# Patient Record
Sex: Male | Born: 1935
Health system: Southern US, Community
[De-identification: ages and names within clinical notes are randomized; demographics above are authoritative.]

## PROBLEM LIST (undated history)

## (undated) DIAGNOSIS — R0600 Dyspnea, unspecified: Secondary | ICD-10-CM

## (undated) DIAGNOSIS — M51369 Other intervertebral disc degeneration, lumbar region without mention of lumbar back pain or lower extremity pain: Secondary | ICD-10-CM

## (undated) DIAGNOSIS — T148XXA Other injury of unspecified body region, initial encounter: Secondary | ICD-10-CM

## (undated) DIAGNOSIS — S2249XA Multiple fractures of ribs, unspecified side, initial encounter for closed fracture: Secondary | ICD-10-CM

## (undated) DIAGNOSIS — I10 Essential (primary) hypertension: Secondary | ICD-10-CM

## (undated) DIAGNOSIS — R079 Chest pain, unspecified: Secondary | ICD-10-CM

## (undated) DIAGNOSIS — E785 Hyperlipidemia, unspecified: Secondary | ICD-10-CM

## (undated) DIAGNOSIS — M1990 Unspecified osteoarthritis, unspecified site: Secondary | ICD-10-CM

## (undated) DIAGNOSIS — C61 Malignant neoplasm of prostate: Secondary | ICD-10-CM

## (undated) DIAGNOSIS — G629 Polyneuropathy, unspecified: Secondary | ICD-10-CM

## (undated) DIAGNOSIS — S36112A Contusion of liver, initial encounter: Secondary | ICD-10-CM

## (undated) DIAGNOSIS — S2239XA Fracture of one rib, unspecified side, initial encounter for closed fracture: Secondary | ICD-10-CM

## (undated) DIAGNOSIS — M5136 Other intervertebral disc degeneration, lumbar region: Secondary | ICD-10-CM

## (undated) DIAGNOSIS — K219 Gastro-esophageal reflux disease without esophagitis: Secondary | ICD-10-CM

## (undated) HISTORY — DX: Other injury of unspecified body region, initial encounter: T14.8XXA

## (undated) HISTORY — DX: Contusion of liver, initial encounter: S36.112A

## (undated) HISTORY — DX: Chest pain, unspecified: R07.9

## (undated) HISTORY — PX: TONSILLECTOMY AND ADENOIDECTOMY: SHX28

## (undated) HISTORY — DX: Essential (primary) hypertension: I10

## (undated) HISTORY — DX: Gastro-esophageal reflux disease without esophagitis: K21.9

## (undated) HISTORY — PX: OTHER SURGICAL HISTORY: SHX169

## (undated) HISTORY — DX: Malignant neoplasm of prostate: C61

## (undated) HISTORY — DX: Dyspnea, unspecified: R06.00

## (undated) HISTORY — DX: Hyperlipidemia, unspecified: E78.5

## (undated) HISTORY — DX: Unspecified osteoarthritis, unspecified site: M19.90

## (undated) HISTORY — DX: Multiple fractures of ribs, unspecified side, initial encounter for closed fracture: S22.49XA

## (undated) HISTORY — DX: Fracture of one rib, unspecified side, initial encounter for closed fracture: S22.39XA

## (undated) HISTORY — PX: TONSILLECTOMY: SUR1361

## (undated) HISTORY — DX: Other intervertebral disc degeneration, lumbar region: M51.36

## (undated) HISTORY — DX: Other intervertebral disc degeneration, lumbar region without mention of lumbar back pain or lower extremity pain: M51.369

---

## 1978-07-31 DIAGNOSIS — T148XXA Other injury of unspecified body region, initial encounter: Secondary | ICD-10-CM

## 1978-07-31 HISTORY — DX: Other injury of unspecified body region, initial encounter: T14.8XXA

## 1994-06-30 DIAGNOSIS — C61 Malignant neoplasm of prostate: Secondary | ICD-10-CM

## 1994-06-30 HISTORY — PX: RADIOACTIVE SEED IMPLANT: SHX5150

## 1994-06-30 HISTORY — DX: Malignant neoplasm of prostate: C61

## 2000-05-01 ENCOUNTER — Encounter (INDEPENDENT_AMBULATORY_CARE_PROVIDER_SITE_OTHER): Payer: Self-pay | Admitting: Specialist

## 2000-05-01 ENCOUNTER — Other Ambulatory Visit: Admission: RE | Admit: 2000-05-01 | Discharge: 2000-05-01 | Payer: Self-pay | Admitting: Urology

## 2000-07-31 HISTORY — PX: CARDIAC CATHETERIZATION: SHX172

## 2008-05-08 ENCOUNTER — Ambulatory Visit (HOSPITAL_COMMUNITY): Admission: RE | Admit: 2008-05-08 | Discharge: 2008-05-08 | Payer: Self-pay | Admitting: Urology

## 2009-06-09 ENCOUNTER — Encounter: Payer: Self-pay | Admitting: Cardiology

## 2009-08-09 ENCOUNTER — Ambulatory Visit: Payer: Self-pay | Admitting: Cardiology

## 2009-08-09 DIAGNOSIS — I10 Essential (primary) hypertension: Secondary | ICD-10-CM | POA: Insufficient documentation

## 2009-08-09 DIAGNOSIS — E669 Obesity, unspecified: Secondary | ICD-10-CM

## 2009-08-09 DIAGNOSIS — E785 Hyperlipidemia, unspecified: Secondary | ICD-10-CM | POA: Insufficient documentation

## 2009-08-09 DIAGNOSIS — R0602 Shortness of breath: Secondary | ICD-10-CM | POA: Insufficient documentation

## 2009-08-18 ENCOUNTER — Ambulatory Visit: Payer: Self-pay | Admitting: Cardiology

## 2009-08-18 ENCOUNTER — Encounter: Payer: Self-pay | Admitting: Cardiology

## 2009-09-14 ENCOUNTER — Ambulatory Visit: Payer: Self-pay | Admitting: Cardiology

## 2009-10-25 ENCOUNTER — Telehealth (INDEPENDENT_AMBULATORY_CARE_PROVIDER_SITE_OTHER): Payer: Self-pay | Admitting: *Deleted

## 2009-11-10 ENCOUNTER — Telehealth (INDEPENDENT_AMBULATORY_CARE_PROVIDER_SITE_OTHER): Payer: Self-pay | Admitting: *Deleted

## 2010-08-30 NOTE — Assessment & Plan Note (Signed)
Summary: np- decreasing exercise tolerance despite   Visit Type:  Initial Consult Primary Provider:  Dr. Margo Common  CC:  Dyspnea.  History of Present Illness: The patient presents for evaluation of dyspnea. This has been ongoing for about 8-9 months. He reports that he has had increasing shortness of breath with activity such as walking 150 feet up a slight incline in his yard. He can contact like he used to. He can't walk on the treadmill for more than 5 minutes without getting some dyspnea. Previously he could walk 30 minutes. He will stop what he is doing and his symptoms will resolve after 2-3 minutes. He does not describe resting shortness of breath, PND or orthopnea. He does not report chest pressure, neck or arm discomfort. He has not had palpitations, presyncope or syncope. He says he's tried to maintain the same level of activity. He has gained 15 pounds in the last 2-3 years. He has no acute weight gain however and no edema.  Of note the patient did have a cardiac catheterization about 9 years ago. He reports that this was normal.  Preventive Screening-Counseling & Management  Alcohol-Tobacco     Smoking Status: never     Year Quit: 1969  Caffeine-Diet-Exercise     Does Patient Exercise: yes      Drug Use:  no.    Current Medications (verified): 1)  Losartan Potassium 50 Mg Tabs (Losartan Potassium) .... Take 1 Tablet By Mouth Once A Day 2)  Simvastatin 10 Mg Tabs (Simvastatin) .... Take 1 Tab By Mouth At Bedtime 3)  Tamsulosin Hcl 0.4 Mg Caps (Tamsulosin Hcl) .... Take 1 Tablet By Mouth Once A Day 4)  Diclofenac Sodium 75 Mg Tbec (Diclofenac Sodium) .... As Needed 5)  Aspir-Low 81 Mg Tbec (Aspirin) .... Take 1 Tablet By Mouth Once A Day 6)  Lupron Depot 3.75 Mg Kit (Leuprolide Acetate) .... Take 3 X Year  Allergies (verified): No Known Drug Allergies  Past History:  Past Medical History: Prostate cancer (18 years ago, seed implants) Arthritis Cath 9 years  ago HTN  Past Surgical History: Cervical spine fusion T/A  Family History: Mother: Family History of Cancer: kidney Father: Family History of Cancer: lung Siblings: Family History of Diabetes:  Brother  CABG (age 38) Child deceased at age 60 with cystic fibrosis  Social History: Retired  Married  Tobacco Use - Quit 1960s after smoking 15 years 1/5 ppd  Alcohol Use - no Regular Exercise - yes Drug Use - no Smoking Status:  never Does Patient Exercise:  yes Drug Use:  no  Review of Systems       As stated in the HPI and negative for all other systems.   Vital Signs:  Patient profile:   77 year old male Height:      69 inches Weight:      235 pounds BMI:     34.83 Pulse rate:   77 / minute BP sitting:   153 / 90  (left arm) Cuff size:   regular  Vitals Entered By: Hoover Brunette, LPN (August 09, 2009 1:30 PM)  Nutrition Counseling: Patient's BMI is greater than 25 and therefore counseled on weight management options. CC: Dyspnea Is Patient Diabetic? No Comments dypnea on exertion.  usually works out 3 x week for 30 - 45 minutes, now only able to stay on 10 -15 minutes.     Physical Exam  General:  Well developed, well nourished, in no acute distress. Head:  normocephalic and  atraumatic Eyes:  PERRLA/EOM intact; conjunctiva and lids normal. Mouth:  Teeth, gums and palate normal. Oral mucosa normal. Neck:  Neck supple, no JVD. No masses, thyromegaly or abnormal cervical nodes. Chest Wall:  no deformities or breast masses noted Lungs:  Clear bilaterally to auscultation and percussion. Abdomen:  Bowel sounds positive; abdomen soft and non-tender without masses, organomegaly, or hernias noted. No hepatosplenomegaly, obese Msk:  Back normal, normal gait. Muscle strength and tone normal. Extremities:  No clubbing or cyanosis. Neurologic:  Alert and oriented x 3. Skin:  Intact without lesions or rashes. Cervical Nodes:  no significant adenopathy Axillary Nodes:  no  significant adenopathy Inguinal Nodes:  no significant adenopathy Psych:  Normal affect.   Detailed Cardiovascular Exam  Neck    Carotids: Carotids full and equal bilaterally without bruits.      Neck Veins: Normal, no JVD.    Heart    Inspection: no deformities or lifts noted.      Palpation: normal PMI with no thrills palpable.      Auscultation: regular rate and rhythm, S1, S2 without murmurs, rubs, gallops, or clicks.    Vascular    Abdominal Aorta: no palpable masses, pulsations, or audible bruits.      Femoral Pulses: normal femoral pulses bilaterally.      Pedal Pulses: normal pedal pulses bilaterally.      Radial Pulses: normal radial pulses bilaterally.      Peripheral Circulation: no clubbing, cyanosis, or edema noted with normal capillary refill.     EKG  Procedure date:  08/09/2009  Findings:      sinus rhythm, rate 61, axis within normal limits, right bundle branch block, no acute ST-T wave changes.  Impression & Recommendations:  Problem # 1:  DYSPNEA (ICD-786.05) I do not strongly suspect a cardiac etiology to this. The only objective finding is his weight and his slightly abnormal EKG. I think the pretest probability of obstructive coronary disease is low. An exercise treadmill test is indicated. I will also check a BNP level. If these are both within normal limits then I will plan pulmonary function testing. Orders: EKG w/ Interpretation (93000) GXT (GXT) T-BNP  (B Natriuretic Peptide) (25366-44034)  Problem # 2:  ESSENTIAL HYPERTENSION, BENIGN (ICD-401.1)  His blood pressure is slightly elevated. I have asked him to drop some weight. We will see what his blood pressure does on the treadmill. He may need medication adjustments. I do not have any prior readings to work with.  His updated medication list for this problem includes:    Losartan Potassium 50 Mg Tabs (Losartan potassium) .Marland Kitchen... Take 1 tablet by mouth once a day    Aspir-low 81 Mg Tbec (Aspirin)  .Marland Kitchen... Take 1 tablet by mouth once a day  Problem # 3:  OBESITY, UNSPECIFIED (ICD-278.00) He and I discussed the need to lose weight with diet and exercise.  Problem # 4:  DYSLIPIDEMIA (ICD-272.4) I reviewed his lipid profile done in November. LDL was 67, HDL 47 and triglycerides 164. He should reduce the fats in his diet but will otherwise remain on the meds as listed.  Patient Instructions: 1)  Your physician has requested that you have an exercise tolerance test.  For further information please visit [email or web address]  Please also follow instruction sheet, as given. We will contact you with an appointment  2)  Your physician recommends that you return for lab work [id number] at the [facility]. 3)  Your physician recommends that you schedule a follow-up appointment in:  FEBRUARY 15TH 2011 @8 :30AM WITH DR. Azaylah Stailey. 4)  Your physician recommends that you continue on your current medications as directed. Please refer to the Current Medication list given to you today.  Appended Document: np- decreasing exercise tolerance despite OK  Appended Document: np- decreasing exercise tolerance despite Patient informed of the above.

## 2010-08-30 NOTE — Assessment & Plan Note (Signed)
Summary: 1 mo fu -srs   Visit Type:  Follow-up Primary Provider:  Dr. Wyvonnia Lora  CC:  Dyspnea.  History of Present Illness: The patient presents for evaluation of dyspnea.  At the first visit I referred him for an exercise treadmill test. He was able to reach 85% of his target heart rate and had no evidence of ischemia. Since that time he has continued to have dyspnea. However, he thinks this is slowly improving. He has been able to increase his exercise treadmill time. He denies any chest discomfort, neck or arm discomfort. He has had no palpitations, presyncope or syncope. He has had no PND or orthopnea. Of note he has had increased blood pressures with systolics typically in the 150s at home.  This is consistent with readings here in the office.  Preventive Screening-Counseling & Management  Alcohol-Tobacco     Smoking Status: quit > 6 months  Comments: Pt smoked from about 77 yo to 77 yo.  Current Medications (verified): 1)  Losartan Potassium 50 Mg Tabs (Losartan Potassium) .... Take 1 Tablet By Mouth Once A Day 2)  Simvastatin 40 Mg Tabs (Simvastatin) .... Take One Tablet By Mouth Daily At Bedtime 3)  Tamsulosin Hcl 0.4 Mg Caps (Tamsulosin Hcl) .... Take 1 Tablet By Mouth Once A Day 4)  Diclofenac Sodium 75 Mg Tbec (Diclofenac Sodium) .... As Needed 5)  Aspir-Low 81 Mg Tbec (Aspirin) .... Take 1 Tablet By Mouth Once A Day 6)  Lupron Depot 3.75 Mg Kit (Leuprolide Acetate) .... Injection Once Every 4 Months 7)  Hydrochlorothiazide 12.5 Mg Tabs (Hydrochlorothiazide) .... Take One Tablet By Mouth Daily. 8)  Losartan Potassium-Hctz 50-12.5 Mg Tabs (Losartan Potassium-Hctz) .... Take 1 Tablet By Mouth Once A Day (Place On File)  Allergies (verified): No Known Drug Allergies  Comments:  Nurse/Medical Assistant: The patient's medications were reviewed with the patient and were updated in the Medication List. Pt brought a list of medications to office visit.  Cyril Loosen, RN,  BSN (September 14, 2009 8:32 AM)  Past History:  Past Medical History: Reviewed history from 08/09/2009 and no changes required. Prostate cancer (18 years ago, seed implants) Arthritis Cath 9 years ago HTN  Past Surgical History: Reviewed history from 08/09/2009 and no changes required. Cervical spine fusion T/A  Social History: Smoking Status:  quit > 6 months  Review of Systems       As stated in the HPI and negative for all other systems.   Vital Signs:  Patient profile:   77 year old male Height:      69 inches Weight:      233 pounds BMI:     34.53 Pulse rate:   56 / minute BP sitting:   151 / 84  (left arm) Cuff size:   regular  Vitals Entered By: Cyril Loosen, RN, BSN (September 14, 2009 8:27 AM)  Nutrition Counseling: Patient's BMI is greater than 25 and therefore counseled on weight management options. CC: Dyspnea Comments Follow up visit. No cardiac complaints.   Physical Exam  General:  Well developed, well nourished, in no acute distress. Head:  normocephalic and atraumatic Eyes:  PERRLA/EOM intact; conjunctiva and lids normal. Mouth:  Teeth, gums and palate normal. Oral mucosa normal. Neck:  Neck supple, no JVD. No masses, thyromegaly or abnormal cervical nodes. Chest Wall:  no deformities or breast masses noted Lungs:  Clear bilaterally to auscultation and percussion. Abdomen:  Bowel sounds positive; abdomen soft and non-tender without masses, organomegaly, or hernias  noted. No hepatosplenomegaly, obese Msk:  Back normal, normal gait. Muscle strength and tone normal. Pulses:  normal pedal pulses bilaterally.   Extremities:  No clubbing or cyanosis. Neurologic:  Alert and oriented x 3. Skin:  Intact without lesions or rashes. Cervical Nodes:  no significant adenopathy Axillary Nodes:  no significant adenopathy Inguinal Nodes:  no significant adenopathy Psych:  Normal affect.   Detailed Cardiovascular Exam  Neck    Carotids: Carotids full  and equal bilaterally without bruits.      Neck Veins: Normal, no JVD.    Heart    Inspection: no deformities or lifts noted.      Palpation: normal PMI with no thrills palpable.      Auscultation: regular rate and rhythm, S1, S2 without murmurs, rubs, gallops, or clicks.    Vascular    Abdominal Aorta: no palpable masses, pulsations, or audible bruits.      Femoral Pulses: normal femoral pulses bilaterally.      Pedal Pulses: normal pedal pulses bilaterally.      Radial Pulses: normal radial pulses bilaterally.      Peripheral Circulation: no clubbing, cyanosis, or edema noted with normal capillary refill.     Impression & Recommendations:  Problem # 1:  DYSPNEA (ICD-786.05) This is slightly improved. Of note his BNP was less than 20. I do not suspect a cardiac etiology and no further workup is indicated.  Problem # 2:  OBESITY, UNSPECIFIED (ICD-278.00) Assessment: Improved He has lost 2 pounds and is reducing his intake of potatoes and bread as I suggested.  I encourage more of the same  Problem # 3:  ESSENTIAL HYPERTENSION, BENIGN (ICD-401.1) His blood pressure is not controlled. I will add hydrochlorothiazide 12.5 mg to his regimen. I will give him a combination pill of Cozaar and HCT. He can keep a blood pressure diary and followup with Dr. Beckey Rutter.  Patient Instructions: 1)  HCTZ 12.5mg  daily to use along with Losartan.  When finish current supply of Losartan, change to med. below. 2)  Losartan/HCTZ 50/12.5 daily 3)  Follow up as needed.   Prescriptions: LOSARTAN POTASSIUM-HCTZ 50-12.5 MG TABS (LOSARTAN POTASSIUM-HCTZ) Take 1 tablet by mouth once a day (place on file)  #30 x 3   Entered by:   Hoover Brunette, LPN   Authorized by:   Rollene Rotunda, MD, [facility]   Signed by:   Hoover Brunette, LPN on 78/29/5621   Method used:   Electronically to        [facility] (retail)       [address]       [place], [place]  [address]       Ph: [phone]       Fax:  [phone]   RxID:   [id number] HYDROCHLOROTHIAZIDE 12.5 MG TABS (HYDROCHLOROTHIAZIDE) Take one tablet by mouth daily.  #30 x 1   Entered by:   Hoover Brunette, LPN   Authorized by:   Rollene Rotunda, MD, [facility]   Signed by:   Hoover Brunette, LPN on 95/63/8756   Method used:   Electronically to        [facility] (retail)       [address]       [place], [place]  [address]       Ph: [phone]       Fax: [phone]   RxID:  [id number]   Prevention & Chronic Care Immunizations   Influenza vaccine: Not documented    Tetanus booster: Not documented    Pneumococcal vaccine: Not documented    H. zoster vaccine: Not documented  Colorectal Screening   Hemoccult: Not documented    Colonoscopy: Not documented  Other Screening   PSA: Not documented   Smoking status: quit > 6 months  (09/14/2009)  Lipids   Total Cholesterol: Not documented   LDL: Not documented   LDL Direct: Not documented   HDL: Not documented   Triglycerides: Not documented    SGOT (AST): Not documented   SGPT (ALT): Not documented   Alkaline phosphatase: Not documented   Total bilirubin: Not documented  Hypertension   Last Blood Pressure: 151 / 84  (09/14/2009)   Serum creatinine: Not documented   Serum potassium Not documented  Self-Management Support :    Hypertension self-management support: Not documented    Lipid self-management support: Not documented

## 2010-08-30 NOTE — Progress Notes (Signed)
Summary: ?WHY HE HAVEN'T RECEIVED HYZAAR   Phone Note Outgoing Call   Call placed by: Carlye Grippe,  November 10, 2009 9:24 AM Call placed to: [facility] Summary of Call: Patient walked into office to check on rx sent last week. Nurse called [facility] to check status of hyzaar rx. spoke with [facility] and she said rx was placed on hold,but would be released to patient now. patient informed. Initial call taken by: Carlye Grippe,  November 10, 2009 9:25 AM

## 2010-08-30 NOTE — Progress Notes (Signed)
Summary: RX REFILL HYZAAR  Phone Note Call from Patient Call back at Home Phone [phone]   Caller: Patient Call For: nurse Summary of Call: message left on machine to please call. nurse returned call and requested 90 day supply of hyzaar be sent to [facility]. nurse informed patient that rx will be sent.  Initial call taken by: Carlye Grippe,  October 25, 2009 4:14 PM    Prescriptions: LOSARTAN POTASSIUM-HCTZ 50-12.5 MG TABS (LOSARTAN POTASSIUM-HCTZ) Take 1 tablet by mouth once a day (place on file)  #90 x 3   Entered by:   Carlye Grippe   Authorized by:   Rollene Rotunda, MD, [facility]   Signed by:   Carlye Grippe on 10/25/2009   Method used:   Electronically to        [facility] (mail-order)             ,          Ph: [phone]       Fax: [phone]   RxID:   [id number]

## 2010-08-30 NOTE — Letter (Signed)
Summary: External Correspondence/ PROGRESS NOTE DR. TAPPER  External Correspondence/ PROGRESS NOTE DR. TAPPER   Imported By: Dorise Hiss 08/09/2009 10:35:02  _____________________________________________________________________  External Attachment:    Type:   Image     Comment:   External Document

## 2010-08-30 NOTE — Letter (Signed)
Summary: Internal Other/ PATIENT HISTORY FORM  Internal Other/ PATIENT HISTORY FORM   Imported By: Dorise Hiss 08/09/2009 15:50:28  _____________________________________________________________________  External Attachment:    Type:   Image     Comment:   External Document

## 2010-08-30 NOTE — Miscellaneous (Signed)
Summary: GXT  Clinical Lists Changes  Observations: Added new observation of ETTFINDING: Current Clinical Presentation :            The patient had no chest pain on presentation.    Baseline ECG :           Normal sinus rhythm. RBBB. Normal repolarization. Exercise Protocol :           The patient exercised for a total of 03:52 min using the Standard Bruce exercise protocol, achieving stage 2 and a max METs of 7. Exercise functional capacity was fair.  Stress ECG :           Sinus tachycardia. RBBB. No ST-segment depression at greater than or equal to 85% MPHR.  Recovery ECG :           Normal sinus rhythm. RBBB. ST segment response during recovery was normal. 5 minutes into recovery.  Stress Test Conclusion :  Normal .            Normal stress test, with no ECG changes consistent with ischemia. There was a normal heart rate response, with a hypertensive blood pressure response. The patient experienced no chest pain. Symptoms noted during stress include: dyspnea. The test was terminated due to dyspnea and achievement of 85% MPHR. An adequate level of stress was achieved.       (08/18/2009 15:39)      Exercise Stress Test  Procedure date:  08/18/2009  Findings:      Current Clinical Presentation :            The patient had no chest pain on presentation.    Baseline ECG :           Normal sinus rhythm. RBBB. Normal repolarization. Exercise Protocol :           The patient exercised for a total of 03:52 min using the Standard Bruce exercise protocol, achieving stage 2 and a max METs of 7. Exercise functional capacity was fair.  Stress ECG :           Sinus tachycardia. RBBB. No ST-segment depression at greater than or equal to 85% MPHR.  Recovery ECG :           Normal sinus rhythm. RBBB. ST segment response during recovery was normal. 5 minutes into recovery.  Stress Test Conclusion :  Normal .            Normal stress test, with no ECG changes consistent with ischemia.  There was a normal heart rate response, with a hypertensive blood pressure response. The patient experienced no chest pain. Symptoms noted during stress include: dyspnea. The test was terminated due to dyspnea and achievement of 85% MPHR. An adequate level of stress was achieved.

## 2012-02-15 ENCOUNTER — Encounter: Payer: Self-pay | Admitting: *Deleted

## 2013-07-09 ENCOUNTER — Encounter (INDEPENDENT_AMBULATORY_CARE_PROVIDER_SITE_OTHER): Payer: Self-pay | Admitting: *Deleted

## 2013-07-21 ENCOUNTER — Encounter (INDEPENDENT_AMBULATORY_CARE_PROVIDER_SITE_OTHER): Payer: Self-pay | Admitting: *Deleted

## 2013-07-21 ENCOUNTER — Ambulatory Visit (INDEPENDENT_AMBULATORY_CARE_PROVIDER_SITE_OTHER): Payer: Medicare Other | Admitting: Internal Medicine

## 2013-07-21 ENCOUNTER — Other Ambulatory Visit (INDEPENDENT_AMBULATORY_CARE_PROVIDER_SITE_OTHER): Payer: Self-pay | Admitting: *Deleted

## 2013-07-21 ENCOUNTER — Encounter (INDEPENDENT_AMBULATORY_CARE_PROVIDER_SITE_OTHER): Payer: Self-pay | Admitting: Internal Medicine

## 2013-07-21 VITALS — BP 134/78 | HR 80 | Temp 98.0°F | Ht 65.0 in | Wt 239.6 lb

## 2013-07-21 DIAGNOSIS — R1310 Dysphagia, unspecified: Secondary | ICD-10-CM

## 2013-07-21 NOTE — Patient Instructions (Signed)
EGD/ED. The risks and benefits such as perforation, bleeding, and infection were reviewed with the patient and is agreeable. 
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
## 2013-07-22 ENCOUNTER — Encounter (HOSPITAL_COMMUNITY)
Admission: RE | Disposition: A | Discharge status: Home / Self Care | Payer: Self-pay | Source: Ambulatory Visit | Attending: Internal Medicine

## 2013-07-22 ENCOUNTER — Ambulatory Visit (HOSPITAL_COMMUNITY)
Admission: RE | Admit: 2013-07-22 | Discharge: 2013-07-22 | Disposition: A | Discharge status: Home / Self Care | Payer: Medicare Other | Source: Ambulatory Visit | Attending: Internal Medicine | Admitting: Internal Medicine

## 2013-07-22 DIAGNOSIS — Z7982 Long term (current) use of aspirin: Secondary | ICD-10-CM | POA: Insufficient documentation

## 2013-07-22 DIAGNOSIS — R1310 Dysphagia, unspecified: Secondary | ICD-10-CM | POA: Insufficient documentation

## 2013-07-22 DIAGNOSIS — K449 Diaphragmatic hernia without obstruction or gangrene: Secondary | ICD-10-CM

## 2013-07-22 DIAGNOSIS — E785 Hyperlipidemia, unspecified: Secondary | ICD-10-CM | POA: Insufficient documentation

## 2013-07-22 DIAGNOSIS — K222 Esophageal obstruction: Secondary | ICD-10-CM

## 2013-07-22 DIAGNOSIS — K2960 Other gastritis without bleeding: Secondary | ICD-10-CM

## 2013-07-22 DIAGNOSIS — I10 Essential (primary) hypertension: Secondary | ICD-10-CM | POA: Insufficient documentation

## 2013-07-22 DIAGNOSIS — Z79899 Other long term (current) drug therapy: Secondary | ICD-10-CM | POA: Insufficient documentation

## 2013-07-22 HISTORY — PX: ESOPHAGOGASTRODUODENOSCOPY (EGD) WITH ESOPHAGEAL DILATION: SHX5812

## 2013-07-22 SURGERY — ESOPHAGOGASTRODUODENOSCOPY (EGD) WITH ESOPHAGEAL DILATION
Anesthesia: Moderate Sedation

## 2013-07-22 MED ORDER — SODIUM CHLORIDE 0.9 % IV SOLN
INTRAVENOUS | Status: DC
  Administered 2013-07-22: 1000 mL via INTRAVENOUS

## 2013-07-22 MED ORDER — MEPERIDINE HCL 25 MG/ML IJ SOLN
INTRAMUSCULAR | Status: DC | PRN
  Administered 2013-07-22 (×2): 25 mg via INTRAVENOUS

## 2013-07-22 MED ORDER — MIDAZOLAM HCL 5 MG/5ML IJ SOLN
INTRAMUSCULAR | Status: DC | PRN
  Administered 2013-07-22 (×2): 2 mg via INTRAVENOUS

## 2013-07-22 MED ORDER — STERILE WATER FOR IRRIGATION IR SOLN
Status: DC | PRN
  Administered 2013-07-22: 08:00:00

## 2013-07-22 MED ORDER — MEPERIDINE HCL 50 MG/ML IJ SOLN
INTRAMUSCULAR | Status: AC
  Filled 2013-07-22: qty 1

## 2013-07-22 MED ORDER — OMEPRAZOLE 20 MG PO CPDR: 20.0000 mg | DELAYED_RELEASE_CAPSULE | Freq: Every day | ORAL | Status: DC

## 2013-07-22 MED ORDER — MIDAZOLAM HCL 5 MG/5ML IJ SOLN
INTRAMUSCULAR | Status: AC
  Filled 2013-07-22: qty 10

## 2013-07-22 MED ORDER — BUTAMBEN-TETRACAINE-BENZOCAINE 2-2-14 % EX AERO
INHALATION_SPRAY | CUTANEOUS | Status: DC | PRN
  Administered 2013-07-22: 2 via TOPICAL

## 2013-07-22 NOTE — Op Note (Signed)
EGD PROCEDURE REPORT  PATIENT:  Travis Moyer  MR#:  [id number] Birthdate:  May 26, 1932, 79 y.o., male Endoscopist:  Dr. Malissa Hippo, MD Referred By:  Dr. Wyvonnia Lora, MD Procedure Date: 07/22/2013  Procedure:   EGD with ED.  Indications:  Patient is 79 year old Caucasian male presents with intermittent solid food dysphagia. He has history of distal esophageal ring. Last EGD was over 3 years ago with removal of foreign body and esophageal dilation.            Informed Consent:  The risks, benefits, alternatives & imponderables which include, but are not limited to, bleeding, infection, perforation, drug reaction and potential missed lesion have been reviewed.  The potential for biopsy, lesion removal, esophageal dilation, etc. have also been discussed.  Questions have been answered.  All parties agreeable.  Please see history & physical in medical record for more information.  Medications:  Demerol 50 mg IV Versed 4 mg IV Cetacaine spray topically for oropharyngeal anesthesia  Description of procedure:  The endoscope was introduced through the mouth and advanced to the second portion of the duodenum without difficulty or limitations. The mucosal surfaces were surveyed very carefully during advancement of the scope and upon withdrawal.  Findings:  Esophagus:  There was coarse appearance to mucosa of the esophagus but no wrinkling or furrows noted. High grade ring noted at GE junction. GEJ:  37 cm Hiatus:  39 cm Stomach:  Stomach was empty and distended very well with insufflation. Folds in the proximal stomach were normal. Examination of mucosa at gastric body was normal. Antral mucosa revealed patchy erythema and single prepyloric erosion. Pyloric channel was patent. Angularis fundus and cardia were examined by retroflex of the scope and were normal. Duodenum:  Normal bulbar and post bulbar mucosa.  Therapeutic/Diagnostic Maneuvers Performed:   Esophagus was dilated by passing 56  [place] Maloney dilator to full insertion. Endoscope was passed in post dilation and ring was noted to have been disrupted. It was further disrupted with a focal biopsy. Biopsy was taken from mucosa in the esophagus for routine histology.   Complications:  None  Impression: High-grade Schatzki's ring.  Ring was disrupted by passing 56 French bony dilator and focal biopsy Small sliding hiatal hernia. Erosive antral gastritis. Biopsy taken from the esophageal body to rule out eosinophilic esophagitis.   Recommendations:  Continue omeprazole at 20 mg by mouth daily. Review old records to make sure he has had H. pylori testing. I will be contacting patient results of biopsy and further recommendations  Shanty Ginty U  07/22/2013  8:10 AM  CC: Dr. Louie Boston, MD & Dr. Bonnetta Barry ref. provider found
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
## 2013-07-29 ENCOUNTER — Encounter (INDEPENDENT_AMBULATORY_CARE_PROVIDER_SITE_OTHER): Payer: Self-pay | Admitting: *Deleted

## 2013-07-29 ENCOUNTER — Encounter (HOSPITAL_COMMUNITY): Payer: Self-pay | Admitting: Internal Medicine

## 2014-01-05 ENCOUNTER — Encounter (INDEPENDENT_AMBULATORY_CARE_PROVIDER_SITE_OTHER): Payer: Self-pay

## 2014-04-07 ENCOUNTER — Telehealth (INDEPENDENT_AMBULATORY_CARE_PROVIDER_SITE_OTHER): Payer: Self-pay | Admitting: Internal Medicine

## 2014-04-07 DIAGNOSIS — K219 Gastro-esophageal reflux disease without esophagitis: Secondary | ICD-10-CM

## 2014-04-07 MED ORDER — OMEPRAZOLE 20 MG PO CPDR: 20.0000 mg | DELAYED_RELEASE_CAPSULE | Freq: Every day | ORAL | Status: DC

## 2014-04-07 NOTE — Telephone Encounter (Signed)
Rx omeprazole eprescribed

## 2014-10-21 ENCOUNTER — Encounter (INDEPENDENT_AMBULATORY_CARE_PROVIDER_SITE_OTHER): Payer: Self-pay | Admitting: Internal Medicine

## 2014-10-21 ENCOUNTER — Ambulatory Visit (INDEPENDENT_AMBULATORY_CARE_PROVIDER_SITE_OTHER): Payer: Medicare Other | Admitting: Internal Medicine

## 2014-10-21 VITALS — BP 132/78 | HR 64 | Temp 97.5°F | Ht 69.0 in | Wt 241.5 lb

## 2014-10-21 DIAGNOSIS — K219 Gastro-esophageal reflux disease without esophagitis: Secondary | ICD-10-CM

## 2014-10-21 DIAGNOSIS — R1314 Dysphagia, pharyngoesophageal phase: Secondary | ICD-10-CM | POA: Diagnosis not present

## 2014-10-21 NOTE — Patient Instructions (Signed)
OV in 1 yr. 
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
## 2015-04-13 ENCOUNTER — Other Ambulatory Visit (INDEPENDENT_AMBULATORY_CARE_PROVIDER_SITE_OTHER): Payer: Self-pay | Admitting: Internal Medicine

## 2015-08-12 ENCOUNTER — Encounter (INDEPENDENT_AMBULATORY_CARE_PROVIDER_SITE_OTHER): Payer: Self-pay | Admitting: Internal Medicine

## 2015-08-12 DIAGNOSIS — C61 Malignant neoplasm of prostate: Secondary | ICD-10-CM | POA: Diagnosis not present

## 2015-08-13 DIAGNOSIS — M47812 Spondylosis without myelopathy or radiculopathy, cervical region: Secondary | ICD-10-CM | POA: Diagnosis not present

## 2015-08-13 DIAGNOSIS — M542 Cervicalgia: Secondary | ICD-10-CM | POA: Diagnosis not present

## 2015-08-18 DIAGNOSIS — K219 Gastro-esophageal reflux disease without esophagitis: Secondary | ICD-10-CM | POA: Diagnosis not present

## 2015-08-18 DIAGNOSIS — M47812 Spondylosis without myelopathy or radiculopathy, cervical region: Secondary | ICD-10-CM | POA: Diagnosis not present

## 2015-08-18 DIAGNOSIS — E7800 Pure hypercholesterolemia, unspecified: Secondary | ICD-10-CM | POA: Diagnosis not present

## 2015-08-18 DIAGNOSIS — M542 Cervicalgia: Secondary | ICD-10-CM | POA: Diagnosis not present

## 2015-08-18 DIAGNOSIS — Z79899 Other long term (current) drug therapy: Secondary | ICD-10-CM | POA: Diagnosis not present

## 2015-08-18 DIAGNOSIS — Z833 Family history of diabetes mellitus: Secondary | ICD-10-CM | POA: Diagnosis not present

## 2015-08-18 DIAGNOSIS — Z809 Family history of malignant neoplasm, unspecified: Secondary | ICD-10-CM | POA: Diagnosis not present

## 2015-08-18 DIAGNOSIS — I10 Essential (primary) hypertension: Secondary | ICD-10-CM | POA: Diagnosis not present

## 2015-09-15 DIAGNOSIS — Z809 Family history of malignant neoplasm, unspecified: Secondary | ICD-10-CM | POA: Diagnosis not present

## 2015-09-15 DIAGNOSIS — K219 Gastro-esophageal reflux disease without esophagitis: Secondary | ICD-10-CM | POA: Diagnosis not present

## 2015-09-15 DIAGNOSIS — M542 Cervicalgia: Secondary | ICD-10-CM | POA: Diagnosis not present

## 2015-09-15 DIAGNOSIS — Z79899 Other long term (current) drug therapy: Secondary | ICD-10-CM | POA: Diagnosis not present

## 2015-09-15 DIAGNOSIS — Z833 Family history of diabetes mellitus: Secondary | ICD-10-CM | POA: Diagnosis not present

## 2015-09-15 DIAGNOSIS — M47812 Spondylosis without myelopathy or radiculopathy, cervical region: Secondary | ICD-10-CM | POA: Diagnosis not present

## 2015-09-15 DIAGNOSIS — E7800 Pure hypercholesterolemia, unspecified: Secondary | ICD-10-CM | POA: Diagnosis not present

## 2015-09-15 DIAGNOSIS — I10 Essential (primary) hypertension: Secondary | ICD-10-CM | POA: Diagnosis not present

## 2015-10-04 DIAGNOSIS — M542 Cervicalgia: Secondary | ICD-10-CM | POA: Diagnosis not present

## 2015-10-04 DIAGNOSIS — Z809 Family history of malignant neoplasm, unspecified: Secondary | ICD-10-CM | POA: Diagnosis not present

## 2015-10-04 DIAGNOSIS — K219 Gastro-esophageal reflux disease without esophagitis: Secondary | ICD-10-CM | POA: Diagnosis not present

## 2015-10-04 DIAGNOSIS — M47812 Spondylosis without myelopathy or radiculopathy, cervical region: Secondary | ICD-10-CM | POA: Diagnosis not present

## 2015-10-04 DIAGNOSIS — E7800 Pure hypercholesterolemia, unspecified: Secondary | ICD-10-CM | POA: Diagnosis not present

## 2015-10-04 DIAGNOSIS — I10 Essential (primary) hypertension: Secondary | ICD-10-CM | POA: Diagnosis not present

## 2015-10-04 DIAGNOSIS — Z79899 Other long term (current) drug therapy: Secondary | ICD-10-CM | POA: Diagnosis not present

## 2015-10-04 DIAGNOSIS — Z833 Family history of diabetes mellitus: Secondary | ICD-10-CM | POA: Diagnosis not present

## 2015-10-25 ENCOUNTER — Ambulatory Visit (INDEPENDENT_AMBULATORY_CARE_PROVIDER_SITE_OTHER): Payer: Medicare Other | Admitting: Internal Medicine

## 2015-10-25 ENCOUNTER — Encounter (INDEPENDENT_AMBULATORY_CARE_PROVIDER_SITE_OTHER): Payer: Self-pay | Admitting: Internal Medicine

## 2015-10-25 VITALS — BP 150/80 | HR 72 | Temp 97.9°F | Ht 69.5 in | Wt 248.5 lb

## 2015-10-25 DIAGNOSIS — K219 Gastro-esophageal reflux disease without esophagitis: Secondary | ICD-10-CM

## 2015-10-25 DIAGNOSIS — R1310 Dysphagia, unspecified: Secondary | ICD-10-CM | POA: Diagnosis not present

## 2015-10-25 MED ORDER — OMEPRAZOLE 20 MG PO CPDR: 20.0000 mg | DELAYED_RELEASE_CAPSULE | Freq: Every day | ORAL | Status: DC

## 2015-10-25 NOTE — Patient Instructions (Signed)
Continue the Prilosec. OV in 1 year. 
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
## 2015-10-26 DIAGNOSIS — G629 Polyneuropathy, unspecified: Secondary | ICD-10-CM | POA: Diagnosis not present

## 2015-10-26 DIAGNOSIS — R5383 Other fatigue: Secondary | ICD-10-CM | POA: Diagnosis not present

## 2015-10-26 DIAGNOSIS — Z6836 Body mass index (BMI) 36.0-36.9, adult: Secondary | ICD-10-CM | POA: Diagnosis not present

## 2015-10-26 DIAGNOSIS — R0600 Dyspnea, unspecified: Secondary | ICD-10-CM | POA: Diagnosis not present

## 2015-10-26 DIAGNOSIS — Z006 Encounter for examination for normal comparison and control in clinical research program: Secondary | ICD-10-CM | POA: Diagnosis not present

## 2015-10-26 DIAGNOSIS — I10 Essential (primary) hypertension: Secondary | ICD-10-CM | POA: Diagnosis not present

## 2015-10-26 DIAGNOSIS — C61 Malignant neoplasm of prostate: Secondary | ICD-10-CM | POA: Diagnosis not present

## 2015-11-09 DIAGNOSIS — M47812 Spondylosis without myelopathy or radiculopathy, cervical region: Secondary | ICD-10-CM | POA: Diagnosis not present

## 2015-11-09 DIAGNOSIS — M542 Cervicalgia: Secondary | ICD-10-CM | POA: Diagnosis not present

## 2015-11-24 DIAGNOSIS — I10 Essential (primary) hypertension: Secondary | ICD-10-CM | POA: Diagnosis not present

## 2015-11-24 DIAGNOSIS — C61 Malignant neoplasm of prostate: Secondary | ICD-10-CM | POA: Diagnosis not present

## 2015-11-24 DIAGNOSIS — M792 Neuralgia and neuritis, unspecified: Secondary | ICD-10-CM | POA: Diagnosis not present

## 2015-11-24 DIAGNOSIS — Z0000 Encounter for general adult medical examination without abnormal findings: Secondary | ICD-10-CM | POA: Diagnosis not present

## 2015-11-24 DIAGNOSIS — E7800 Pure hypercholesterolemia, unspecified: Secondary | ICD-10-CM | POA: Diagnosis not present

## 2015-11-24 DIAGNOSIS — Z6838 Body mass index (BMI) 38.0-38.9, adult: Secondary | ICD-10-CM | POA: Diagnosis not present

## 2015-11-30 DIAGNOSIS — M47812 Spondylosis without myelopathy or radiculopathy, cervical region: Secondary | ICD-10-CM | POA: Diagnosis not present

## 2016-01-14 DIAGNOSIS — A6920 Lyme disease, unspecified: Secondary | ICD-10-CM | POA: Diagnosis not present

## 2016-02-15 DIAGNOSIS — C61 Malignant neoplasm of prostate: Secondary | ICD-10-CM | POA: Diagnosis not present

## 2016-02-15 DIAGNOSIS — I10 Essential (primary) hypertension: Secondary | ICD-10-CM | POA: Diagnosis not present

## 2016-02-15 DIAGNOSIS — Z923 Personal history of irradiation: Secondary | ICD-10-CM | POA: Diagnosis not present

## 2016-02-15 DIAGNOSIS — Z79899 Other long term (current) drug therapy: Secondary | ICD-10-CM | POA: Diagnosis not present

## 2016-02-15 DIAGNOSIS — Z006 Encounter for examination for normal comparison and control in clinical research program: Secondary | ICD-10-CM | POA: Diagnosis not present

## 2016-03-30 DIAGNOSIS — C61 Malignant neoplasm of prostate: Secondary | ICD-10-CM | POA: Diagnosis not present

## 2016-05-30 DIAGNOSIS — Z23 Encounter for immunization: Secondary | ICD-10-CM | POA: Diagnosis not present

## 2016-05-31 DIAGNOSIS — H40053 Ocular hypertension, bilateral: Secondary | ICD-10-CM | POA: Diagnosis not present

## 2016-06-06 DIAGNOSIS — E7800 Pure hypercholesterolemia, unspecified: Secondary | ICD-10-CM | POA: Diagnosis not present

## 2016-06-06 DIAGNOSIS — I10 Essential (primary) hypertension: Secondary | ICD-10-CM | POA: Diagnosis not present

## 2016-06-06 DIAGNOSIS — C61 Malignant neoplasm of prostate: Secondary | ICD-10-CM | POA: Diagnosis not present

## 2016-06-06 DIAGNOSIS — Z79899 Other long term (current) drug therapy: Secondary | ICD-10-CM | POA: Diagnosis not present

## 2016-06-06 DIAGNOSIS — Z006 Encounter for examination for normal comparison and control in clinical research program: Secondary | ICD-10-CM | POA: Diagnosis not present

## 2016-07-13 DIAGNOSIS — H52223 Regular astigmatism, bilateral: Secondary | ICD-10-CM | POA: Diagnosis not present

## 2016-07-13 DIAGNOSIS — H25813 Combined forms of age-related cataract, bilateral: Secondary | ICD-10-CM | POA: Diagnosis not present

## 2016-08-09 ENCOUNTER — Encounter (INDEPENDENT_AMBULATORY_CARE_PROVIDER_SITE_OTHER): Payer: Self-pay | Admitting: Internal Medicine

## 2016-08-09 ENCOUNTER — Encounter (INDEPENDENT_AMBULATORY_CARE_PROVIDER_SITE_OTHER): Payer: Self-pay

## 2016-08-14 ENCOUNTER — Other Ambulatory Visit (HOSPITAL_COMMUNITY): Payer: Medicare Other

## 2016-08-23 NOTE — Patient Instructions (Signed)
BASILIO SCHLICHTING  08/23/2016     @PREFPERIOPPHARMACY @   Your procedure is scheduled on 09/01/2016.  Report to [facility] at 6:15 A.M.  Call this number if you have problems the morning of surgery:  [phone]   Remember:  Do not eat food or drink liquids after midnight.  Take these medicines the morning of surgery with A SIP OF WATER Xtandi, Gabapentin, Prilosec, Flomax   Do not wear jewelry, make-up or nail polish.  Do not wear lotions, powders, or perfumes, or deoderant.  Do not shave 48 hours prior to surgery.  Men may shave face and neck.  Do not bring valuables to the hospital.  [facility] is not responsible for any belongings or valuables.  Contacts, dentures or bridgework may not be worn into surgery.  Leave your suitcase in the car.  After surgery it may be brought to your room.  For patients admitted to the hospital, discharge time will be determined by your treatment team.  Patients discharged the day of surgery will not be allowed to drive home.    Please read over the following fact sheets that you were given. Anesthesia Post-op Instructions     PATIENT INSTRUCTIONS POST-ANESTHESIA  IMMEDIATELY FOLLOWING SURGERY:  Do not drive or operate machinery for the first twenty four hours after surgery.  Do not make any important decisions for twenty four hours after surgery or while taking narcotic pain medications or sedatives.  If you develop intractable nausea and vomiting or a severe headache please notify your doctor immediately.  FOLLOW-UP:  Please make an appointment with your surgeon as instructed. You do not need to follow up with anesthesia unless specifically instructed to do so.  WOUND CARE INSTRUCTIONS (if applicable):  Keep a dry clean dressing on the anesthesia/puncture wound site if there is drainage.  Once the wound has quit draining you may leave it open to air.  Generally you should leave the bandage intact for twenty four hours unless there is  drainage.  If the epidural site drains for more than 36-48 hours please call the anesthesia department.  QUESTIONS?:  Please feel free to call your physician or the hospital operator if you have any questions, and they will be happy to assist you.      Cataract Surgery Cataract surgery is a procedure to remove a cataract from your eye. A cataract is cloudiness on the lens of your eye. The lens focuses light inside the eye. When a lens becomes cloudy, your vision is affected. Cataract surgery is a procedure to remove the cloudy lens. A substitute lens (intraocular lens or IOL) is usually inserted as a replacement for the cloudy lens. Tell a health care provider about:  Any allergies you have.  All medicines you are taking, including vitamins, herbs, eye drops, creams, and over-the-counter medicines.  Any problems you or family members have had with anesthetic medicines.  Any blood disorders you have.  Any surgeries you have had, especially eye surgeries that include refractive surgery, such as PRK and LASIK.  Any medical conditions you have.  Whether you are pregnant or may be pregnant. What are the risks? Generally, this is a safe procedure. However, problems may occur, including:  Infection.  Bleeding.  Glaucoma.  Retinal detachment.  Allergic reactions to medicines.  Damage to other structures or organs.  Inflammation of the eye.  Clouding of the part of your eye that holds an IOL in place (after-cataract), if an IOL was inserted. This is  fairly common.  An IOL moving out of position, if an IOL was inserted. This is very rare.  Loss of vision. This is rare. What happens before the procedure?  Follow instructions from your health care provider about eating or drinking restrictions.  Ask your health care provider about:  Changing or stopping your regular medicines, including any eye drops you have been prescribed. This is especially important if you are taking  diabetes medicines or blood thinners.  Taking medicines such as aspirin and ibuprofen. These medicines can thin your blood. Do not take these medicines before your procedure if your health care provider instructs you not to.  Do not put contact lenses in either eye on the day of your surgery.  Plan for someone to drive you to and from the procedure.  If you will be going home right after the procedure, plan to have someone with you for 24 hours. What happens during the procedure?  An IV tube may be inserted into one of your veins.  You will be given one or more of the following:  A medicine to help you relax (sedative).  A medicine to numb the area (local anesthetic). This may be numbing eye drops or an injection that is given behind the eye.  A small cut (incision) will be made to the edge of the clear, dome-shaped surface that covers the front of the eye (cornea).  A small probe will be inserted into the eye. This device gives off ultrasound waves that soften and break up the cloudy center of the lens. This makes it easier for the cloudy lens to be removed by suction.  An IOL may be implanted.  Part of the capsule that surrounds the lens will be left in the eye to support the IOL.  Your surgeon may use stitches (sutures) to close the incision. The procedure may vary among health care providers and hospitals. What happens after the procedure?  Your blood pressure, heart rate, breathing rate, and blood oxygen level will be monitored often until the medicines you were given have worn off.  You may be given a protective shield to wear over your eyes.  Do not drive for 24 hours if you received a sedative. This information is not intended to replace advice given to you by your health care provider. Make sure you discuss any questions you have with your health care provider. Document Released: 07/06/2011 Document Revised: 12/23/2015 Document Reviewed: 05/27/2015 Elsevier Interactive  Patient Education  2017 [employer].

## 2016-08-25 DIAGNOSIS — H25812 Combined forms of age-related cataract, left eye: Secondary | ICD-10-CM | POA: Diagnosis not present

## 2016-08-28 ENCOUNTER — Encounter (HOSPITAL_COMMUNITY)
Admission: RE | Admit: 2016-08-28 | Discharge: 2016-08-28 | Disposition: A | Discharge status: Home / Self Care | Payer: Medicare Other | Source: Ambulatory Visit | Attending: Ophthalmology | Admitting: Ophthalmology

## 2016-08-28 ENCOUNTER — Encounter (HOSPITAL_COMMUNITY): Payer: Self-pay

## 2016-08-28 DIAGNOSIS — Z01810 Encounter for preprocedural cardiovascular examination: Secondary | ICD-10-CM | POA: Insufficient documentation

## 2016-08-28 DIAGNOSIS — Z01812 Encounter for preprocedural laboratory examination: Secondary | ICD-10-CM | POA: Insufficient documentation

## 2016-08-28 HISTORY — DX: Polyneuropathy, unspecified: G62.9

## 2016-08-28 LAB — BASIC METABOLIC PANEL WITH GFR
Anion gap: 9 (ref 5–15)
BUN: 18 mg/dL (ref 6–20)
CO2: 29 mmol/L (ref 22–32)
Calcium: 9.3 mg/dL (ref 8.9–10.3)
Chloride: 100 mmol/L — ABNORMAL LOW (ref 101–111)
Creatinine, Ser: 1.12 mg/dL (ref 0.61–1.24)
GFR calc Af Amer: >60 mL/min
GFR calc non Af Amer: 59 mL/min — ABNORMAL LOW
Glucose, Bld: 140 mg/dL — ABNORMAL HIGH (ref 65–99)
Potassium: 4.2 mmol/L (ref 3.5–5.1)
Sodium: 138 mmol/L (ref 135–145)

## 2016-08-28 LAB — CBC
HCT: 38.2 % — ABNORMAL LOW (ref 39.0–52.0)
Hemoglobin: 13.1 g/dL (ref 13.0–17.0)
MCH: 32.1 pg (ref 26.0–34.0)
MCHC: 34.3 g/dL (ref 30.0–36.0)
MCV: 93.6 fL (ref 78.0–100.0)
Platelets: 187 10*3/uL (ref 150–400)
RBC: 4.08 MIL/uL — ABNORMAL LOW (ref 4.22–5.81)
RDW: 12.6 % (ref 11.5–15.5)
WBC: 7.4 10*3/uL (ref 4.0–10.5)

## 2016-08-28 NOTE — Progress Notes (Signed)
   08/28/16 1002  OBSTRUCTIVE SLEEP APNEA  Have you ever been diagnosed with sleep apnea through a sleep study? No  Do you snore loudly (loud enough to be heard through closed doors)?  1  Do you often feel tired, fatigued, or sleepy during the daytime (such as falling asleep during driving or talking to someone)? 1  Has anyone observed you stop breathing during your sleep? 1  Do you have, or are you being treated for high blood pressure? 1  BMI more than 35 kg/m2? 1  Age > 50 (1-yes) 1  Neck circumference greater than:Male 16 inches or larger, Male 17inches or larger? 1  Male Gender (Yes=1) 1  Obstructive Sleep Apnea Score 8  Score 5 or greater  Results sent to PCP

## 2016-08-29 DIAGNOSIS — C61 Malignant neoplasm of prostate: Secondary | ICD-10-CM | POA: Diagnosis not present

## 2016-08-29 DIAGNOSIS — Z006 Encounter for examination for normal comparison and control in clinical research program: Secondary | ICD-10-CM | POA: Diagnosis not present

## 2016-08-31 MED ORDER — CYCLOPENTOLATE-PHENYLEPHRINE 0.2-1 % OP SOLN
OPHTHALMIC | Status: AC
  Filled 2016-08-31: qty 2

## 2016-08-31 MED ORDER — LIDOCAINE HCL 3.5 % OP GEL
OPHTHALMIC | Status: AC
  Filled 2016-08-31: qty 1

## 2016-08-31 MED ORDER — TETRACAINE HCL 0.5 % OP SOLN
OPHTHALMIC | Status: AC
  Filled 2016-08-31: qty 4

## 2016-08-31 MED ORDER — NEOMYCIN-POLYMYXIN-DEXAMETH 3.5-10000-0.1 OP SUSP
OPHTHALMIC | Status: AC
  Filled 2016-08-31: qty 5

## 2016-08-31 MED ORDER — LIDOCAINE HCL (PF) 1 % IJ SOLN
INTRAMUSCULAR | Status: AC
  Filled 2016-08-31: qty 2

## 2016-08-31 MED ORDER — PHENYLEPHRINE HCL 2.5 % OP SOLN
OPHTHALMIC | Status: AC
  Filled 2016-08-31: qty 15

## 2016-09-01 ENCOUNTER — Encounter (HOSPITAL_COMMUNITY)
Admission: RE | Disposition: A | Discharge status: Home / Self Care | Payer: Self-pay | Source: Ambulatory Visit | Attending: Ophthalmology

## 2016-09-01 ENCOUNTER — Ambulatory Visit (HOSPITAL_COMMUNITY): Payer: Medicare Other | Admitting: Anesthesiology

## 2016-09-01 ENCOUNTER — Encounter (HOSPITAL_COMMUNITY): Payer: Self-pay | Admitting: *Deleted

## 2016-09-01 ENCOUNTER — Ambulatory Visit (HOSPITAL_COMMUNITY)
Admission: RE | Admit: 2016-09-01 | Discharge: 2016-09-01 | Disposition: A | Discharge status: Home / Self Care | Payer: Medicare Other | Source: Ambulatory Visit | Attending: Ophthalmology | Admitting: Ophthalmology

## 2016-09-01 DIAGNOSIS — K219 Gastro-esophageal reflux disease without esophagitis: Secondary | ICD-10-CM | POA: Diagnosis not present

## 2016-09-01 DIAGNOSIS — I10 Essential (primary) hypertension: Secondary | ICD-10-CM | POA: Diagnosis not present

## 2016-09-01 DIAGNOSIS — Z79899 Other long term (current) drug therapy: Secondary | ICD-10-CM | POA: Insufficient documentation

## 2016-09-01 DIAGNOSIS — H52222 Regular astigmatism, left eye: Secondary | ICD-10-CM | POA: Insufficient documentation

## 2016-09-01 DIAGNOSIS — Z87891 Personal history of nicotine dependence: Secondary | ICD-10-CM | POA: Insufficient documentation

## 2016-09-01 DIAGNOSIS — H2512 Age-related nuclear cataract, left eye: Secondary | ICD-10-CM | POA: Diagnosis not present

## 2016-09-01 DIAGNOSIS — H25812 Combined forms of age-related cataract, left eye: Secondary | ICD-10-CM | POA: Diagnosis not present

## 2016-09-01 DIAGNOSIS — H269 Unspecified cataract: Secondary | ICD-10-CM | POA: Diagnosis not present

## 2016-09-01 HISTORY — PX: CATARACT EXTRACTION W/PHACO: SHX586

## 2016-09-01 SURGERY — PHACOEMULSIFICATION, CATARACT, WITH IOL INSERTION
Anesthesia: Monitor Anesthesia Care | Site: Eye | Laterality: Left

## 2016-09-01 MED ORDER — PROVISC 10 MG/ML IO SOLN
INTRAOCULAR | Status: DC | PRN
  Administered 2016-09-01: 0.85 mL via INTRAOCULAR

## 2016-09-01 MED ORDER — EPINEPHRINE PF 1 MG/ML IJ SOLN
INTRAMUSCULAR | Status: AC
  Filled 2016-09-01: qty 1

## 2016-09-01 MED ORDER — TETRACAINE HCL 0.5 % OP SOLN
1.0000 [drp] | OPHTHALMIC | Status: AC
  Administered 2016-09-01 (×3): 1 [drp] via OPHTHALMIC

## 2016-09-01 MED ORDER — BSS IO SOLN
INTRAOCULAR | Status: DC | PRN
  Administered 2016-09-01: 15 mL

## 2016-09-01 MED ORDER — MIDAZOLAM HCL 5 MG/5ML IJ SOLN
INTRAMUSCULAR | Status: DC | PRN
  Administered 2016-09-01: 2 mg via INTRAVENOUS

## 2016-09-01 MED ORDER — SODIUM HYALURONATE 23 MG/ML IO SOLN
INTRAOCULAR | Status: DC | PRN
  Administered 2016-09-01: 0.6 mL via INTRAOCULAR

## 2016-09-01 MED ORDER — POVIDONE-IODINE 5 % OP SOLN
OPHTHALMIC | Status: DC | PRN
  Administered 2016-09-01: 1 via OPHTHALMIC

## 2016-09-01 MED ORDER — LACTATED RINGERS IV SOLN
INTRAVENOUS | Status: DC
  Administered 2016-09-01: 1000 mL via INTRAVENOUS

## 2016-09-01 MED ORDER — LIDOCAINE HCL 3.5 % OP GEL
OPHTHALMIC | Status: DC | PRN
  Administered 2016-09-01: 1 via OPHTHALMIC

## 2016-09-01 MED ORDER — MIDAZOLAM HCL 2 MG/2ML IJ SOLN
1.0000 mg | INTRAMUSCULAR | Status: DC | PRN
  Administered 2016-09-01: 2 mg via INTRAVENOUS

## 2016-09-01 MED ORDER — NEOMYCIN-POLYMYXIN-DEXAMETH 3.5-10000-0.1 OP SUSP
OPHTHALMIC | Status: DC | PRN
  Administered 2016-09-01: 2 [drp] via OPHTHALMIC

## 2016-09-01 MED ORDER — CYCLOPENTOLATE-PHENYLEPHRINE 0.2-1 % OP SOLN
1.0000 [drp] | OPHTHALMIC | Status: AC
  Administered 2016-09-01 (×3): 1 [drp] via OPHTHALMIC

## 2016-09-01 MED ORDER — PHENYLEPHRINE HCL 2.5 % OP SOLN
1.0000 [drp] | OPHTHALMIC | Status: AC
  Administered 2016-09-01 (×3): 1 [drp] via OPHTHALMIC

## 2016-09-01 MED ORDER — MIDAZOLAM HCL 2 MG/2ML IJ SOLN
INTRAMUSCULAR | Status: AC
  Filled 2016-09-01: qty 2

## 2016-09-01 MED ORDER — EPINEPHRINE PF 1 MG/ML IJ SOLN
INTRAOCULAR | Status: DC | PRN
  Administered 2016-09-01: 500 mL

## 2016-09-01 MED ORDER — EPINEPHRINE PF 1 MG/ML IJ SOLN
INTRAOCULAR | Status: DC | PRN
  Administered 2016-09-01: .8 mL via OPHTHALMIC

## 2016-09-01 SURGICAL SUPPLY — 15 items
CLOTH BEACON ORANGE TIMEOUT ST (SAFETY) ×3 IMPLANT
EYE SHIELD UNIVERSAL CLEAR (GAUZE/BANDAGES/DRESSINGS) ×3 IMPLANT
GLOVE BIOGEL PI IND STRL 6.5 (GLOVE) ×1 IMPLANT
GLOVE BIOGEL PI IND STRL 8 (GLOVE) ×1 IMPLANT
GLOVE BIOGEL PI INDICATOR 6.5 (GLOVE) ×2
GLOVE BIOGEL PI INDICATOR 8 (GLOVE) ×2
LENS IOL ACRYSOF IQ TORIC 15.0 ×3 IMPLANT
PAD ARMBOARD 7.5X6 YLW CONV (MISCELLANEOUS) ×3 IMPLANT
PROC W SPEC LENS (INTRAOCULAR LENS) ×3
PROCESS W SPEC LENS (INTRAOCULAR LENS) ×1 IMPLANT
SYR TB 1ML LL NO SAFETY (SYRINGE) ×3 IMPLANT
TAPE SURG TRANSPORE 1 IN (GAUZE/BANDAGES/DRESSINGS) ×1 IMPLANT
TAPE SURGICAL TRANSPORE 1 IN (GAUZE/BANDAGES/DRESSINGS) ×2
VISCOELASTIC ADDITIONAL (OPHTHALMIC RELATED) ×3 IMPLANT
WATER STERILE IRR 250ML POUR (IV SOLUTION) ×3 IMPLANT

## 2016-09-01 NOTE — Anesthesia Procedure Notes (Signed)
Procedure Name: MAC Date/Time: 09/01/2016 7:30 AM Performed by: Andree Elk, AMY A Pre-anesthesia Checklist: Patient identified, Timeout performed, Emergency Drugs available, Suction available and Patient being monitored Oxygen Delivery Method: Nasal cannula

## 2016-09-01 NOTE — H&P (Signed)
The H and P was reviewed and updated. The patient was examined.  No changes were found after exam.  The surgical eye was marked.  

## 2016-09-01 NOTE — Transfer of Care (Signed)
Immediate Anesthesia Transfer of Care Note  Patient: Travis Moyer  Procedure(s) Performed: Procedure(s) with comments: CATARACT EXTRACTION PHACO AND INTRAOCULAR LENS PLACEMENT (IOC) (Left) - CDE: 8.81  Patient Location: PACU  Anesthesia Type:MAC  Level of Consciousness: awake, alert , oriented and patient cooperative  Airway & Oxygen Therapy: Patient Spontanous Breathing  Post-op Assessment: Report given to RN and Post -op Vital signs reviewed and stable  Post vital signs: Reviewed and stable  Last Vitals:  Vitals:   09/01/16 0725 09/01/16 0730  BP: 119/75 118/72  Pulse:    Resp: (!) 24 20  Temp:      Last Pain:  Vitals:   09/01/16 0638  TempSrc: Oral      Patients Stated Pain Goal: 5 (94/94/47 3958)  Complications: No apparent anesthesia complications

## 2016-09-01 NOTE — Op Note (Signed)
Date of procedure:   Pre-operative diagnosis: Visually significant cataract, Left Eye; Regular Astigmatism, Left eye  Post-operative diagnosis: Visually significant cataract, Left Eye; Regular Astigmatism, Left eye  Procedure: Removal of cataract via phacoemulsification and insertion of intra-ocular lens AMO ZCT150 20.0+D into the capsular bag of the Left Eye  Attending surgeon: Gerda Diss. Kelsea Mousel, MD, MA  Anesthesia: MAC, Topical Akten  Complications: None  Estimated Blood Loss: <35m (minimal)  Specimens: None  Implants: As above  Indications:  Visually significant cataract, Left Eye  Procedure:  The patient was seen and identified in the pre-operative area. The operative eye was identified and dilated.  The operative eye was marked with the pre-op toric markers.  Topical anesthesia was administered to the operative eye.     The patient was then to the operative suite and placed in the supine position.  A timeout was performed confirming the patient, procedure to be performed, and all other relevant information.   The patient's face was prepped and draped in the usual fashion for intra-ocular surgery.  A lid speculum was placed into the operative eye and the surgical microscope moved into place and focused.  A superotemporal paracentesis was created using a 15 degree blade.  Shugarcaine was injected into the anterior chamber.  Viscoelastic was injected into the anterior chamber.  A temporal clear-corneal main wound incision was created using a 2.420mmicrokeratome.  A continuous curvilinear capsulorrhexis was initiated using an irrigating cystitome and completed using capsulorrhexis forceps.  Hydrodissection and hydrodeliniation were performed.  Viscoelastic was injected into the anterior chamber.  A phacoemulsification handpiece and a chopper as a second instrument were used to remove the nucleus and epinucleus. The irrigation/aspiration handpiece was used to remove any remaining cortical  material.   The capsular bag was reinflated with viscoelastic, checked, and found to be intact. The AM[place]CT150 +20.0 intraocular lens was inserted into the capsular bag and dialed into place using a Sinskey hook along the 1 degree meridian.  The irrigation/aspiration handpiece was used to remove any remaining viscoelastic.  The clear corneal wound and paracentesis wounds were then hydrated and checked with Weck-Cels to be watertight.  The lid-speculum and drape was removed, and the patient's face was cleaned with a wet and dry 4x4.  Maxitrol was instilled in the eye before a clear shield was taped over the eye. The patient was taken to the post-operative care unit in good condition, having tolerated the procedure well.  Post-Op Instructions: The [facility] post-operative evaluation and will receive all other orders and instructions.

## 2016-09-01 NOTE — Anesthesia Postprocedure Evaluation (Signed)
Anesthesia Post Note  Patient: Travis Moyer  Procedure(s) Performed: Procedure(s) (LRB): CATARACT EXTRACTION PHACO AND INTRAOCULAR LENS PLACEMENT (IOC) (Left)  Patient location during evaluation: Short Stay Anesthesia Type: MAC Level of consciousness: awake and alert and oriented Pain management: pain level controlled Vital Signs Assessment: post-procedure vital signs reviewed and stable Respiratory status: spontaneous breathing Cardiovascular status: stable Postop Assessment: no signs of nausea or vomiting Anesthetic complications: no     Last Vitals:  Vitals:   09/01/16 0725 09/01/16 0730  BP: 119/75 118/72  Pulse:    Resp: (!) 24 20  Temp:      Last Pain:  Vitals:   09/01/16 0638  TempSrc: Oral                 ADAMS, AMY A

## 2016-09-01 NOTE — Anesthesia Preprocedure Evaluation (Signed)
Anesthesia Evaluation  Patient identified by MRN, date of birth, ID band Patient awake    Reviewed: Allergy & Precautions, NPO status , Patient's Chart, lab work & pertinent test results  Airway Mallampati: III  TM Distance: >3 FB     Dental  (+) Poor Dentition, Chipped   Pulmonary shortness of breath, former smoker,    breath sounds clear to auscultation       Cardiovascular hypertension, Pt. on medications  Rhythm:Regular Rate:Normal     Neuro/Psych    GI/Hepatic GERD  ,  Endo/Other    Renal/GU      Musculoskeletal   Abdominal   Peds  Hematology   Anesthesia Other Findings   Reproductive/Obstetrics                             Anesthesia Physical Anesthesia Plan  ASA: II  Anesthesia Plan: MAC   Post-op Pain Management:    Induction: Intravenous  Airway Management Planned:   Additional Equipment:   Intra-op Plan:   Post-operative Plan:   Informed Consent: I have reviewed the patients History and Physical, chart, labs and discussed the procedure including the risks, benefits and alternatives for the proposed anesthesia with the patient or authorized representative who has indicated his/her understanding and acceptance.     Plan Discussed with:   Anesthesia Plan Comments:         Anesthesia Quick Evaluation

## 2016-09-01 NOTE — Discharge Instructions (Signed)
Moderate Conscious Sedation, Adult, Care After These instructions provide you with information about caring for yourself after your procedure. Your health care provider may also give you more specific instructions. Your treatment has been planned according to current medical practices, but problems sometimes occur. Call your health care provider if you have any problems or questions after your procedure. What can I expect after the procedure? After your procedure, it is common:  To feel sleepy for several hours.  To feel clumsy and have poor balance for several hours.  To have poor judgment for several hours.  To vomit if you eat too soon. Follow these instructions at home: For at least 24 hours after the procedure:   Do not:  Participate in activities where you could fall or become injured.  Drive.  Use heavy machinery.  Drink alcohol.  Take sleeping pills or medicines that cause drowsiness.  Make important decisions or sign legal documents.  Take care of children on your own.  Rest. Eating and drinking  Follow the diet recommended by your health care provider.  If you vomit:  Drink water, juice, or soup when you can drink without vomiting.  Make sure you have little or no nausea before eating solid foods. General instructions  Have a responsible adult stay with you until you are awake and alert.  Take over-the-counter and prescription medicines only as told by your health care provider.  If you smoke, do not smoke without supervision.  Keep all follow-up visits as told by your health care provider. This is important. Contact a health care provider if:  You keep feeling nauseous or you keep vomiting.  You feel light-headed.  You develop a rash.  You have a fever. Get help right away if:  You have trouble breathing. This information is not intended to replace advice given to you by your health care provider. Make sure you discuss any questions you have  with your health care provider. Document Released: 05/07/2013 Document Revised: 12/20/2015 Document Reviewed: 11/06/2015 Elsevier Interactive Patient Education  2017 [employer].  Please discharge patient when stable, will follow up today with Dr. Marisa Hua at the [facility] office at [place] in place until visit.  All paperwork with discharge instructions will be given at the office.

## 2016-09-04 ENCOUNTER — Encounter (HOSPITAL_COMMUNITY): Payer: Self-pay | Admitting: Ophthalmology

## 2016-09-22 DIAGNOSIS — H25811 Combined forms of age-related cataract, right eye: Secondary | ICD-10-CM | POA: Diagnosis not present

## 2016-09-27 ENCOUNTER — Encounter (HOSPITAL_COMMUNITY): Payer: Self-pay

## 2016-09-27 ENCOUNTER — Encounter (HOSPITAL_COMMUNITY)
Admission: RE | Admit: 2016-09-27 | Discharge: 2016-09-27 | Disposition: A | Discharge status: Home / Self Care | Payer: Medicare Other | Source: Ambulatory Visit | Attending: Ophthalmology | Admitting: Ophthalmology

## 2016-09-28 MED ORDER — LIDOCAINE HCL (PF) 1 % IJ SOLN
INTRAMUSCULAR | Status: AC
  Filled 2016-09-28: qty 2

## 2016-09-28 MED ORDER — CYCLOPENTOLATE-PHENYLEPHRINE 0.2-1 % OP SOLN
OPHTHALMIC | Status: AC
  Filled 2016-09-28: qty 2

## 2016-09-28 MED ORDER — NEOMYCIN-POLYMYXIN-DEXAMETH 3.5-10000-0.1 OP SUSP
OPHTHALMIC | Status: AC
  Filled 2016-09-28: qty 5

## 2016-09-28 MED ORDER — TETRACAINE HCL 0.5 % OP SOLN
OPHTHALMIC | Status: AC
  Filled 2016-09-28: qty 4

## 2016-09-28 MED ORDER — PHENYLEPHRINE HCL 2.5 % OP SOLN
OPHTHALMIC | Status: AC
  Filled 2016-09-28: qty 15

## 2016-09-28 MED ORDER — LIDOCAINE HCL 3.5 % OP GEL
OPHTHALMIC | Status: AC
  Filled 2016-09-28: qty 1

## 2016-09-29 ENCOUNTER — Ambulatory Visit (HOSPITAL_COMMUNITY): Payer: Medicare Other | Admitting: Anesthesiology

## 2016-09-29 ENCOUNTER — Encounter (HOSPITAL_COMMUNITY)
Admission: RE | Disposition: A | Discharge status: Home / Self Care | Payer: Self-pay | Source: Ambulatory Visit | Attending: Ophthalmology

## 2016-09-29 ENCOUNTER — Ambulatory Visit (HOSPITAL_COMMUNITY)
Admission: RE | Admit: 2016-09-29 | Discharge: 2016-09-29 | Disposition: A | Discharge status: Home / Self Care | Payer: Medicare Other | Source: Ambulatory Visit | Attending: Ophthalmology | Admitting: Ophthalmology

## 2016-09-29 ENCOUNTER — Encounter (HOSPITAL_COMMUNITY): Payer: Self-pay | Admitting: *Deleted

## 2016-09-29 DIAGNOSIS — H52221 Regular astigmatism, right eye: Secondary | ICD-10-CM | POA: Diagnosis not present

## 2016-09-29 DIAGNOSIS — H25811 Combined forms of age-related cataract, right eye: Secondary | ICD-10-CM | POA: Diagnosis not present

## 2016-09-29 DIAGNOSIS — H2511 Age-related nuclear cataract, right eye: Secondary | ICD-10-CM | POA: Diagnosis not present

## 2016-09-29 DIAGNOSIS — H269 Unspecified cataract: Secondary | ICD-10-CM | POA: Diagnosis not present

## 2016-09-29 HISTORY — PX: CATARACT EXTRACTION W/PHACO: SHX586

## 2016-09-29 SURGERY — PHACOEMULSIFICATION, CATARACT, WITH IOL INSERTION
Anesthesia: Monitor Anesthesia Care | Site: Eye | Laterality: Right

## 2016-09-29 MED ORDER — PROVISC 10 MG/ML IO SOLN
INTRAOCULAR | Status: DC | PRN
  Administered 2016-09-29: 0.85 mL via INTRAOCULAR

## 2016-09-29 MED ORDER — FENTANYL CITRATE (PF) 100 MCG/2ML IJ SOLN
INTRAMUSCULAR | Status: AC
  Filled 2016-09-29: qty 2

## 2016-09-29 MED ORDER — CYCLOPENTOLATE-PHENYLEPHRINE 0.2-1 % OP SOLN
1.0000 [drp] | OPHTHALMIC | Status: AC
  Administered 2016-09-29 (×3): 1 [drp] via OPHTHALMIC

## 2016-09-29 MED ORDER — BSS IO SOLN
INTRAOCULAR | Status: DC | PRN
  Administered 2016-09-29: 15 mL

## 2016-09-29 MED ORDER — PHENYLEPHRINE HCL 2.5 % OP SOLN
1.0000 [drp] | OPHTHALMIC | Status: AC | PRN
  Administered 2016-09-29 (×3): 1 [drp] via OPHTHALMIC

## 2016-09-29 MED ORDER — POVIDONE-IODINE 5 % OP SOLN
OPHTHALMIC | Status: DC | PRN
  Administered 2016-09-29: 1 via OPHTHALMIC

## 2016-09-29 MED ORDER — MIDAZOLAM HCL 2 MG/2ML IJ SOLN
1.0000 mg | INTRAMUSCULAR | Status: AC
  Administered 2016-09-29: 2 mg via INTRAVENOUS

## 2016-09-29 MED ORDER — TETRACAINE HCL 0.5 % OP SOLN
1.0000 [drp] | OPHTHALMIC | Status: AC | PRN
  Administered 2016-09-29 (×3): 1 [drp] via OPHTHALMIC

## 2016-09-29 MED ORDER — EPINEPHRINE PF 1 MG/ML IJ SOLN
INTRAOCULAR | Status: DC | PRN
  Administered 2016-09-29: 500 mL

## 2016-09-29 MED ORDER — LIDOCAINE HCL 3.5 % OP GEL
1.0000 "application " | Freq: Once | OPHTHALMIC | Status: AC
  Administered 2016-09-29: 1 via OPHTHALMIC

## 2016-09-29 MED ORDER — EPINEPHRINE PF 1 MG/ML IJ SOLN
INTRAMUSCULAR | Status: AC
  Filled 2016-09-29: qty 1

## 2016-09-29 MED ORDER — SODIUM HYALURONATE 23 MG/ML IO SOLN
INTRAOCULAR | Status: DC | PRN
  Administered 2016-09-29: 0.6 mL via INTRAOCULAR

## 2016-09-29 MED ORDER — LACTATED RINGERS IV SOLN
INTRAVENOUS | Status: DC
  Administered 2016-09-29: 07:00:00 via INTRAVENOUS

## 2016-09-29 MED ORDER — MIDAZOLAM HCL 2 MG/2ML IJ SOLN
INTRAMUSCULAR | Status: AC
  Filled 2016-09-29: qty 2

## 2016-09-29 MED ORDER — LIDOCAINE HCL (PF) 1 % IJ SOLN
INTRAOCULAR | Status: DC | PRN
  Administered 2016-09-29: .9 mL via OPHTHALMIC

## 2016-09-29 MED ORDER — LIDOCAINE HCL (PF) 1 % IJ SOLN
INTRAMUSCULAR | Status: AC
  Filled 2016-09-29: qty 2

## 2016-09-29 MED ORDER — FENTANYL CITRATE (PF) 100 MCG/2ML IJ SOLN
25.0000 ug | INTRAMUSCULAR | Status: AC
  Administered 2016-09-29: 100 ug via INTRAVENOUS

## 2016-09-29 MED ORDER — NEOMYCIN-POLYMYXIN-DEXAMETH 3.5-10000-0.1 OP SUSP
OPHTHALMIC | Status: DC | PRN
  Administered 2016-09-29: 2 [drp] via OPHTHALMIC

## 2016-09-29 SURGICAL SUPPLY — 13 items
CLOTH BEACON ORANGE TIMEOUT ST (SAFETY) ×3 IMPLANT
EYE SHIELD UNIVERSAL CLEAR (GAUZE/BANDAGES/DRESSINGS) ×3 IMPLANT
GLOVE BIOGEL PI IND STRL 7.0 (GLOVE) ×1 IMPLANT
GLOVE BIOGEL PI INDICATOR 7.0 (GLOVE) ×2
LENS IOL ACRYSOF IQ TORIC 15.0 ×3 IMPLANT
PAD ARMBOARD 7.5X6 YLW CONV (MISCELLANEOUS) ×3 IMPLANT
PROC W SPEC LENS (INTRAOCULAR LENS) ×3
PROCESS W SPEC LENS (INTRAOCULAR LENS) ×1 IMPLANT
SYR TB 1ML LL NO SAFETY (SYRINGE) ×3 IMPLANT
TAPE SURG TRANSPORE 1 IN (GAUZE/BANDAGES/DRESSINGS) ×1 IMPLANT
TAPE SURGICAL TRANSPORE 1 IN (GAUZE/BANDAGES/DRESSINGS) ×2
VISCOELASTIC ADDITIONAL (OPHTHALMIC RELATED) ×3 IMPLANT
WATER STERILE IRR 250ML POUR (IV SOLUTION) ×3 IMPLANT

## 2016-09-29 NOTE — Op Note (Signed)
Date of procedure: 09/29/16  Pre-operative diagnosis: Visually significant cataract, Right Eye; Regular Astigmatism, Right Eye  Post-operative diagnosis: Visually significant cataract, Right Eye; Regular Astigmatism, Right Eye  Procedure: Removal of cataract via phacoemulsification and insertion of intra-ocular lens AMO ZCT150 +20.5D into the capsular bag of the Right Eye  Attending surgeon: Gerda Diss. Brandalyn Harting, MD, MA  Anesthesia: MAC, Topical Akten  Complications: None  Estimated Blood Loss: <43m (minimal)  Specimens: None  Implants: As above  Indications:  Visually significant cataract, Right Eye  Procedure:  The patient was seen and identified in the pre-operative area. The operative eye was identified and dilated.  The operative eye was marked with the pre-op toric marker at 0 degrees.  Topical anesthesia was administered to the operative eye.     The patient was then to the operative suite and placed in the supine position.  A timeout was performed confirming the patient, procedure to be performed, and all other relevant information.   The patient's face was prepped and draped in the usual fashion for intra-ocular surgery.  A lid speculum was placed into the operative eye and the surgical microscope moved into place and focused.  A superotemporal paracentesis was created using a 20 gauge blade.  Shugarcaine was injected into the anterior chamber.  Viscoelastic was injected into the anterior chamber.  A temporal clear-corneal main wound incision was created using a 2.4110mmicrokeratome.  A continuous curvilinear capsulorrhexis was initiated using an irrigating cystitome and completed using capsulorrhexis forceps.  Hydrodissection and hydrodeliniation were performed.  Viscoelastic was injected into the anterior chamber.  A phacoemulsification handpiece and a chopper as a second instrument were used to remove the nucleus and epinucleus. The irrigation/aspiration handpiece was used to remove  any remaining cortical material.   The capsular bag was reinflated with viscoelastic, checked, and found to be intact. An AMO ZCT150 +20.5 lens was inserted into the capsular bag and dialed into place using a Sinskey hook.  The irrigation/aspiration handpiece was used to remove any remaining viscoelastic.  The clear corneal wound and paracentesis wounds were then hydrated and checked with Weck-Cels to be watertight.  The lid-speculum and drape was removed, and the patient's face was cleaned with a wet and dry 4x4.  Maxitrol was instilled in the eye before a clear shield was taped over the eye. The patient was taken to the post-operative care unit in good condition, having tolerated the procedure well.  Post-Op Instructions: The [facility]operative [facility] and will receive all other orders and instructions.

## 2016-09-29 NOTE — Interval H&P Note (Signed)
History and Physical Interval Note:  09/29/2016 7:24 AM  Travis Moyer  has presented today for surgery, with the diagnosis of nuclear cataract right eye  The various methods of treatment have been discussed with the patient and family. After consideration of risks, benefits and other options for treatment, the patient has consented to  Procedure(s) with comments: CATARACT EXTRACTION PHACO AND INTRAOCULAR LENS PLACEMENT (IOC) (Right) - right as a surgical intervention .  The patient's history has been reviewed, patient examined, no change in status, stable for surgery.  I have reviewed the patient's chart and labs.  Questions were answered to the patient's satisfaction.     Baruch Goldmann

## 2016-09-29 NOTE — Anesthesia Preprocedure Evaluation (Signed)
Anesthesia Evaluation  Patient identified by MRN, date of birth, ID band Patient awake    Reviewed: Allergy & Precautions, NPO status , Patient's Chart, lab work & pertinent test results  Airway Mallampati: III  TM Distance: >3 FB     Dental  (+) Poor Dentition, Chipped   Pulmonary shortness of breath, former smoker,    breath sounds clear to auscultation       Cardiovascular hypertension, Pt. on medications  Rhythm:Regular Rate:Normal     Neuro/Psych    GI/Hepatic GERD  ,  Endo/Other    Renal/GU      Musculoskeletal   Abdominal   Peds  Hematology   Anesthesia Other Findings   Reproductive/Obstetrics                             Anesthesia Physical Anesthesia Plan  ASA: II  Anesthesia Plan: MAC   Post-op Pain Management:    Induction: Intravenous  Airway Management Planned: Nasal Cannula  Additional Equipment:   Intra-op Plan:   Post-operative Plan:   Informed Consent: I have reviewed the patients History and Physical, chart, labs and discussed the procedure including the risks, benefits and alternatives for the proposed anesthesia with the patient or authorized representative who has indicated his/her understanding and acceptance.     Plan Discussed with:   Anesthesia Plan Comments:         Anesthesia Quick Evaluation

## 2016-09-29 NOTE — Discharge Instructions (Addendum)
° ° °  PATIENT INSTRUCTIONS POST-ANESTHESIA  IMMEDIATELY FOLLOWING SURGERY:  Do not drive or operate machinery for the first twenty four hours after surgery.  Do not make any important decisions for twenty four hours after surgery or while taking narcotic pain medications or sedatives.  If you develop intractable nausea and vomiting or a severe headache please notify your doctor immediately.  FOLLOW-UP:  Please make an appointment with your surgeon as instructed. You do not need to follow up with anesthesia unless specifically instructed to do so.  WOUND CARE INSTRUCTIONS (if applicable):  Keep a dry clean dressing on the anesthesia/puncture wound site if there is drainage.  Once the wound has quit draining you may leave it open to air.  Generally you should leave the bandage intact for twenty four hours unless there is drainage.  If the epidural site drains for more than 36-48 hours please call the anesthesia department.  QUESTIONS?:  Please feel free to call your physician or the hospital operator if you have any questions, and they will be happy to assist you.      Please discharge patient when stable, will follow up today with Dr. Marisa Hua at the [facility] office at 10:00AM.  Leave shield in place until visit.  All paperwork with discharge instructions will be given at the office.

## 2016-09-29 NOTE — Transfer of Care (Signed)
Immediate Anesthesia Transfer of Care Note  Patient: Travis Moyer  Procedure(s) Performed: Procedure(s) with comments: CATARACT EXTRACTION PHACO AND INTRAOCULAR LENS PLACEMENT (IOC) (Right) - CDE: 25.87  Patient Location: PACU  Anesthesia Type:MAC  Level of Consciousness: awake, alert , oriented and patient cooperative  Airway & Oxygen Therapy: Patient Spontanous Breathing and Patient connected to nasal cannula oxygen  Post-op Assessment: Report given to RN and Post -op Vital signs reviewed and stable  Post vital signs: Reviewed and stable  Last Vitals:  Vitals:   09/29/16 0635 09/29/16 0645  BP:  129/76  Resp:  (!) 23  Temp: 36.6 C     Last Pain:  Vitals:   09/29/16 0635  TempSrc: Oral      Patients Stated Pain Goal: 7 (29/51/88 4166)  Complications: No apparent anesthesia complications

## 2016-09-29 NOTE — Anesthesia Postprocedure Evaluation (Signed)
Anesthesia Post Note  Patient: GLEB MCGUIRE  Procedure(s) Performed: Procedure(s) (LRB): CATARACT EXTRACTION PHACO AND INTRAOCULAR LENS PLACEMENT (IOC) (Right)  Patient location during evaluation: Short Stay Anesthesia Type: MAC Level of consciousness: awake and alert, oriented and patient cooperative Pain management: pain level controlled Vital Signs Assessment: post-procedure vital signs reviewed and stable Respiratory status: spontaneous breathing and respiratory function stable Cardiovascular status: blood pressure returned to baseline and stable Postop Assessment: no headache, adequate PO intake, no signs of nausea or vomiting, no backache and patient able to bend at knees Anesthetic complications: no     Last Vitals:  Vitals:   09/29/16 0635 09/29/16 0645  BP:  129/76  Resp:  (!) 23  Temp: 36.6 C     Last Pain:  Vitals:   09/29/16 0635  TempSrc: Oral                 Anglea Gordner

## 2016-09-29 NOTE — H&P (View-Only) (Signed)
The H and P was reviewed and updated. The patient was examined.  No changes were found after exam.  The surgical eye was marked.  

## 2016-10-02 ENCOUNTER — Encounter (HOSPITAL_COMMUNITY): Payer: Self-pay | Admitting: Ophthalmology

## 2016-10-24 ENCOUNTER — Encounter (INDEPENDENT_AMBULATORY_CARE_PROVIDER_SITE_OTHER): Payer: Self-pay

## 2016-10-24 ENCOUNTER — Ambulatory Visit (INDEPENDENT_AMBULATORY_CARE_PROVIDER_SITE_OTHER): Payer: Medicare Other | Admitting: Internal Medicine

## 2016-10-24 ENCOUNTER — Encounter (INDEPENDENT_AMBULATORY_CARE_PROVIDER_SITE_OTHER): Payer: Self-pay | Admitting: Internal Medicine

## 2016-10-24 VITALS — BP 122/84 | HR 88 | Temp 98.3°F | Ht 69.5 in | Wt 251.9 lb

## 2016-10-24 DIAGNOSIS — R1310 Dysphagia, unspecified: Secondary | ICD-10-CM | POA: Diagnosis not present

## 2016-10-24 DIAGNOSIS — R1319 Other dysphagia: Secondary | ICD-10-CM
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
## 2016-10-24 NOTE — Patient Instructions (Signed)
Continue the Omeprazole.  OV in 1 year.  

## 2016-11-04 ENCOUNTER — Other Ambulatory Visit (INDEPENDENT_AMBULATORY_CARE_PROVIDER_SITE_OTHER): Payer: Self-pay | Admitting: Internal Medicine

## 2016-11-04 DIAGNOSIS — R1310 Dysphagia, unspecified: Secondary | ICD-10-CM

## 2016-11-04 DIAGNOSIS — K219 Gastro-esophageal reflux disease without esophagitis: Secondary | ICD-10-CM

## 2016-11-15 DIAGNOSIS — C61 Malignant neoplasm of prostate: Secondary | ICD-10-CM | POA: Diagnosis not present

## 2016-11-21 DIAGNOSIS — Z006 Encounter for examination for normal comparison and control in clinical research program: Secondary | ICD-10-CM | POA: Diagnosis not present

## 2016-11-21 DIAGNOSIS — Z79899 Other long term (current) drug therapy: Secondary | ICD-10-CM | POA: Diagnosis not present

## 2016-11-21 DIAGNOSIS — I10 Essential (primary) hypertension: Secondary | ICD-10-CM | POA: Diagnosis not present

## 2016-11-21 DIAGNOSIS — C61 Malignant neoplasm of prostate: Secondary | ICD-10-CM | POA: Diagnosis not present

## 2016-11-21 DIAGNOSIS — E7800 Pure hypercholesterolemia, unspecified: Secondary | ICD-10-CM | POA: Diagnosis not present

## 2016-11-21 DIAGNOSIS — Z888 Allergy status to other drugs, medicaments and biological substances status: Secondary | ICD-10-CM | POA: Diagnosis not present

## 2016-12-28 DIAGNOSIS — Z6839 Body mass index (BMI) 39.0-39.9, adult: Secondary | ICD-10-CM | POA: Diagnosis not present

## 2016-12-28 DIAGNOSIS — E7800 Pure hypercholesterolemia, unspecified: Secondary | ICD-10-CM | POA: Diagnosis not present

## 2016-12-28 DIAGNOSIS — C61 Malignant neoplasm of prostate: Secondary | ICD-10-CM | POA: Diagnosis not present

## 2016-12-28 DIAGNOSIS — I10 Essential (primary) hypertension: Secondary | ICD-10-CM | POA: Diagnosis not present

## 2016-12-28 DIAGNOSIS — Z0000 Encounter for general adult medical examination without abnormal findings: Secondary | ICD-10-CM | POA: Diagnosis not present

## 2016-12-28 DIAGNOSIS — R0602 Shortness of breath: Secondary | ICD-10-CM | POA: Diagnosis not present

## 2017-01-02 DIAGNOSIS — Z0000 Encounter for general adult medical examination without abnormal findings: Secondary | ICD-10-CM | POA: Diagnosis not present

## 2017-01-02 DIAGNOSIS — Z87891 Personal history of nicotine dependence: Secondary | ICD-10-CM | POA: Diagnosis not present

## 2017-01-02 DIAGNOSIS — R0602 Shortness of breath: Secondary | ICD-10-CM | POA: Diagnosis not present

## 2017-01-02 DIAGNOSIS — I700 Atherosclerosis of aorta: Secondary | ICD-10-CM | POA: Diagnosis not present

## 2017-01-12 DIAGNOSIS — I2510 Atherosclerotic heart disease of native coronary artery without angina pectoris: Secondary | ICD-10-CM | POA: Diagnosis not present

## 2017-01-12 DIAGNOSIS — R0602 Shortness of breath: Secondary | ICD-10-CM | POA: Diagnosis not present

## 2017-01-12 DIAGNOSIS — I517 Cardiomegaly: Secondary | ICD-10-CM | POA: Diagnosis not present

## 2017-01-16 DIAGNOSIS — E7800 Pure hypercholesterolemia, unspecified: Secondary | ICD-10-CM | POA: Diagnosis not present

## 2017-01-16 DIAGNOSIS — R0602 Shortness of breath: Secondary | ICD-10-CM | POA: Diagnosis not present

## 2017-01-16 DIAGNOSIS — I10 Essential (primary) hypertension: Secondary | ICD-10-CM | POA: Diagnosis not present

## 2017-02-06 ENCOUNTER — Ambulatory Visit (INDEPENDENT_AMBULATORY_CARE_PROVIDER_SITE_OTHER): Payer: Medicare Other | Admitting: Cardiovascular Disease

## 2017-02-06 ENCOUNTER — Encounter: Payer: Self-pay | Admitting: Cardiovascular Disease

## 2017-02-06 VITALS — BP 161/88 | HR 84 | Ht 70.0 in | Wt 254.4 lb

## 2017-02-06 DIAGNOSIS — R0609 Other forms of dyspnea: Secondary | ICD-10-CM | POA: Diagnosis not present

## 2017-02-06 DIAGNOSIS — I119 Hypertensive heart disease without heart failure: Secondary | ICD-10-CM | POA: Diagnosis not present

## 2017-02-06 DIAGNOSIS — E785 Hyperlipidemia, unspecified: Secondary | ICD-10-CM

## 2017-02-06 DIAGNOSIS — I10 Essential (primary) hypertension: Secondary | ICD-10-CM | POA: Diagnosis not present

## 2017-02-06 DIAGNOSIS — I4510 Unspecified right bundle-branch block: Secondary | ICD-10-CM

## 2017-02-06 NOTE — Patient Instructions (Signed)
Medication Instructions:  Your physician recommends that you continue on your current medications as directed. Please refer to the Current Medication list given to you today.  Labwork: NONE  Testing/Procedures: Your physician has requested that you have a lexiscan myoview. For further information please visit [email or web address]. Please follow instruction sheet, as given.   Follow-Up: Your physician recommends that you schedule a follow-up appointment in: 6 WEEKS WITH DR. KONESWARAN   Any Other Special Instructions Will Be Listed Below (If Applicable).  If you need a refill on your cardiac medications before your next appointment, please call your pharmacy. 
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
## 2017-02-08 ENCOUNTER — Telehealth: Payer: Self-pay | Admitting: Cardiovascular Disease

## 2017-02-08 NOTE — Telephone Encounter (Signed)
Pre-cert Verification for the following procedure   Lexiscan myoview  Scheduled for 02-14-17

## 2017-02-14 ENCOUNTER — Encounter (HOSPITAL_BASED_OUTPATIENT_CLINIC_OR_DEPARTMENT_OTHER)
Admission: RE | Admit: 2017-02-14 | Discharge: 2017-02-14 | Disposition: A | Discharge status: Home / Self Care | Payer: Medicare Other | Source: Ambulatory Visit | Attending: Cardiovascular Disease | Admitting: Cardiovascular Disease

## 2017-02-14 ENCOUNTER — Encounter (HOSPITAL_COMMUNITY)
Admission: RE | Admit: 2017-02-14 | Discharge: 2017-02-14 | Disposition: A | Discharge status: Home / Self Care | Payer: Medicare Other | Source: Ambulatory Visit | Attending: Cardiovascular Disease | Admitting: Cardiovascular Disease

## 2017-02-14 ENCOUNTER — Encounter (HOSPITAL_COMMUNITY): Payer: Self-pay

## 2017-02-14 DIAGNOSIS — R0609 Other forms of dyspnea: Secondary | ICD-10-CM | POA: Insufficient documentation

## 2017-02-14 LAB — NM MYOCAR MULTI W/SPECT W/WALL MOTION / EF
CHL CUP NUCLEAR SRS: 2
LV sys vol: 10 mL
LVDIAVOL: 49 mL (ref 62–150)
Peak HR: 104 {beats}/min
RATE: 0.33
Rest HR: 72 {beats}/min
SDS: 2
SSS: 4
TID: 0.78

## 2017-02-14 MED ORDER — REGADENOSON 0.4 MG/5ML IV SOLN
INTRAVENOUS | Status: AC
  Administered 2017-02-14: 0.4 mg via INTRAVENOUS
  Filled 2017-02-14: qty 5

## 2017-02-14 MED ORDER — TECHNETIUM TC 99M TETROFOSMIN IV KIT
10.0000 | PACK | Freq: Once | INTRAVENOUS | Status: AC | PRN
  Administered 2017-02-14: 11 via INTRAVENOUS

## 2017-02-14 MED ORDER — SODIUM CHLORIDE 0.9% FLUSH
INTRAVENOUS | Status: AC
  Administered 2017-02-14: 10 mL via INTRAVENOUS
  Filled 2017-02-14: qty 10

## 2017-02-14 MED ORDER — TECHNETIUM TC 99M TETROFOSMIN IV KIT
30.0000 | PACK | Freq: Once | INTRAVENOUS | Status: AC | PRN
  Administered 2017-02-14: 33 via INTRAVENOUS

## 2017-02-15 ENCOUNTER — Telehealth: Payer: Self-pay | Admitting: *Deleted

## 2017-02-15 NOTE — Telephone Encounter (Signed)
Notes recorded by Laurine Blazer, LPN on 8/33/7445 at 14:60 AM EDT Wife Barbaraann Share) notified. Copy to pmd. Follow up already scheduled for 03/28/2017 ------  Notes recorded by Herminio Commons, MD on 02/14/2017 at 3:35 PM EDT Normal.

## 2017-02-20 DIAGNOSIS — I10 Essential (primary) hypertension: Secondary | ICD-10-CM | POA: Diagnosis not present

## 2017-02-20 DIAGNOSIS — E7800 Pure hypercholesterolemia, unspecified: Secondary | ICD-10-CM | POA: Diagnosis not present

## 2017-02-20 DIAGNOSIS — G629 Polyneuropathy, unspecified: Secondary | ICD-10-CM | POA: Diagnosis not present

## 2017-02-20 DIAGNOSIS — Z9889 Other specified postprocedural states: Secondary | ICD-10-CM | POA: Diagnosis not present

## 2017-02-20 DIAGNOSIS — Z79899 Other long term (current) drug therapy: Secondary | ICD-10-CM | POA: Diagnosis not present

## 2017-02-20 DIAGNOSIS — Z08 Encounter for follow-up examination after completed treatment for malignant neoplasm: Secondary | ICD-10-CM | POA: Diagnosis not present

## 2017-02-20 DIAGNOSIS — C61 Malignant neoplasm of prostate: Secondary | ICD-10-CM | POA: Diagnosis not present

## 2017-02-20 DIAGNOSIS — Z8546 Personal history of malignant neoplasm of prostate: Secondary | ICD-10-CM | POA: Diagnosis not present

## 2017-03-21 ENCOUNTER — Ambulatory Visit: Payer: Medicare Other | Admitting: Cardiovascular Disease

## 2017-03-28 ENCOUNTER — Encounter: Payer: Self-pay | Admitting: Cardiovascular Disease

## 2017-03-28 ENCOUNTER — Ambulatory Visit (INDEPENDENT_AMBULATORY_CARE_PROVIDER_SITE_OTHER): Payer: Medicare Other | Admitting: Cardiovascular Disease

## 2017-03-28 VITALS — BP 158/78 | HR 98 | Ht 70.0 in | Wt 255.0 lb

## 2017-03-28 DIAGNOSIS — I10 Essential (primary) hypertension: Secondary | ICD-10-CM | POA: Diagnosis not present

## 2017-03-28 DIAGNOSIS — E785 Hyperlipidemia, unspecified: Secondary | ICD-10-CM

## 2017-03-28 DIAGNOSIS — I119 Hypertensive heart disease without heart failure: Secondary | ICD-10-CM | POA: Diagnosis not present

## 2017-03-28 DIAGNOSIS — R0609 Other forms of dyspnea: Secondary | ICD-10-CM | POA: Diagnosis not present

## 2017-03-28 MED ORDER — AMLODIPINE BESYLATE 5 MG PO TABS: 5.0000 mg | ORAL_TABLET | Freq: Every day | ORAL | 0 refills | Status: DC

## 2017-03-28 NOTE — Patient Instructions (Signed)
Medication Instructions:  Your physician has recommended you make the following change in your medication:   Begin Amlodipine 5 mg daily  Please continue all other medications as prescribed  Labwork: NONE  Testing/Procedures: Your physician has recommended that you have a pulmonary function test. Pulmonary Function Tests are a group of tests that measure how well air moves in and out of your lungs.  Follow-Up: Your physician recommends that you schedule a follow-up appointment in: 8-10 WEEKS WITH DR. Bronson Ing   Any Other Special Instructions Will Be Listed Below (If Applicable).  If you need a refill on your cardiac medications before your next appointment, please call your pharmacy.
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
## 2017-04-03 ENCOUNTER — Telehealth: Payer: Self-pay | Admitting: *Deleted

## 2017-04-03 NOTE — Telephone Encounter (Signed)
Wife Barbaraann Share) calling with concerns about taking the Amlodipine.  Stated the pharmacist discussed with them that it could cause some side effects.  Call placed to pharm ([facility]) - pharm stated that he discussed with them that taking Amlodipine with Simvastatin could decrease the metabolism of the Simvastatin and may cause issues with myopathy, although this is rare.  If person already has issues with myopathy, this combination could increase the side effects.  Also, told them that there had been some studies out that Vitamin D may help to decrease these symptoms.    Please advise if any changes need to be made.

## 2017-04-04 MED ORDER — PRAVASTATIN SODIUM 40 MG PO TABS: 40.0000 mg | ORAL_TABLET | Freq: Every evening | ORAL | 6 refills | Status: DC

## 2017-04-04 NOTE — Telephone Encounter (Signed)
I would switch simvastatin to either Crestor 20 mg or pravastatin 40 mg. These statins have been well studied when taken together with other meds and have the lowest risk of adverse effects.

## 2017-04-04 NOTE — Telephone Encounter (Signed)
6-8 week f/u is already scheduled for 05/29/2017 with Dr. Bronson Ing.

## 2017-04-04 NOTE — Telephone Encounter (Signed)
Wife Barbaraann Share) notified.  Will send in new rx for Pravastatin 40mg  daily.  Stated he has been on Crestor & Mevacor before & was not effective.

## 2017-04-13 ENCOUNTER — Inpatient Hospital Stay (HOSPITAL_COMMUNITY): Admission: RE | Admit: 2017-04-13 | Payer: Medicare Other | Source: Ambulatory Visit

## 2017-04-25 ENCOUNTER — Ambulatory Visit (HOSPITAL_COMMUNITY)
Admission: RE | Admit: 2017-04-25 | Discharge: 2017-04-25 | Disposition: A | Discharge status: Home / Self Care | Payer: Medicare Other | Source: Ambulatory Visit | Attending: Cardiovascular Disease | Admitting: Cardiovascular Disease

## 2017-04-25 DIAGNOSIS — R0609 Other forms of dyspnea: Secondary | ICD-10-CM | POA: Insufficient documentation

## 2017-04-25 MED ORDER — ALBUTEROL SULFATE (2.5 MG/3ML) 0.083% IN NEBU
2.5000 mg | INHALATION_SOLUTION | Freq: Once | RESPIRATORY_TRACT | Status: AC
  Administered 2017-04-25: 2.5 mg via RESPIRATORY_TRACT

## 2017-05-07 LAB — PULMONARY FUNCTION TEST
DL/VA % PRED: 69 %
DL/VA: 3.13 ml/min/mmHg/L
DLCO UNC % PRED: 39 %
DLCO cor % pred: 39 %
DLCO cor: 12.36 ml/min/mmHg
DLCO unc: 12.36 ml/min/mmHg
FEF 25-75 PRE: 3.4 L/s
FEF 25-75 Post: 2.64 L/sec
FEF2575-%CHANGE-POST: -22 %
FEF2575-%PRED-POST: 152 %
FEF2575-%Pred-Pre: 196 %
FEV1-%CHANGE-POST: -4 %
FEV1-%PRED-PRE: 78 %
FEV1-%Pred-Post: 74 %
FEV1-PRE: 2.05 L
FEV1-Post: 1.96 L
FEV1FVC-%Change-Post: -1 %
FEV1FVC-%PRED-PRE: 123 %
FEV6-%Change-Post: -2 %
FEV6-%Pred-Post: 65 %
FEV6-%Pred-Pre: 67 %
FEV6-Post: 2.28 L
FEV6-Pre: 2.34 L
FEV6FVC-%Pred-Post: 107 %
FEV6FVC-%Pred-Pre: 107 %
FVC-%Change-Post: -2 %
FVC-%Pred-Post: 61 %
FVC-%Pred-Pre: 62 %
FVC-Post: 2.28 L
FVC-Pre: 2.34 L
POST FEV1/FVC RATIO: 86 %
PRE FEV6/FVC RATIO: 100 %
Post FEV6/FVC ratio: 100 %
Pre FEV1/FVC ratio: 87 %

## 2017-05-08 DIAGNOSIS — R413 Other amnesia: Secondary | ICD-10-CM | POA: Diagnosis not present

## 2017-05-08 DIAGNOSIS — R4189 Other symptoms and signs involving cognitive functions and awareness: Secondary | ICD-10-CM | POA: Diagnosis not present

## 2017-05-08 DIAGNOSIS — C61 Malignant neoplasm of prostate: Secondary | ICD-10-CM | POA: Diagnosis not present

## 2017-05-08 DIAGNOSIS — R0683 Snoring: Secondary | ICD-10-CM | POA: Diagnosis not present

## 2017-05-08 DIAGNOSIS — I10 Essential (primary) hypertension: Secondary | ICD-10-CM | POA: Diagnosis not present

## 2017-05-10 ENCOUNTER — Telehealth: Payer: Self-pay | Admitting: *Deleted

## 2017-05-10 DIAGNOSIS — R942 Abnormal results of pulmonary function studies: Secondary | ICD-10-CM

## 2017-05-10 NOTE — Telephone Encounter (Signed)
Follow up already scheduled with Dr. Bronson Ing for 05/29/2017.

## 2017-05-10 NOTE — Telephone Encounter (Signed)
Notes recorded by Laurine Blazer, LPN on 95/63/8756 at 9:44 AM EDT Patient notified. Copy to pmd. He agrees to see pulmonologist & prefers to stay local. Referral will be done for Dr. Luan Pulling in [place]. [facility]) will be made aware. ------  Notes recorded by Herminio Commons, MD on 05/09/2017 at 3:53 PM EDT Abnormalities noted. Please make referral to pulmonary.

## 2017-05-17 DIAGNOSIS — Z23 Encounter for immunization: Secondary | ICD-10-CM | POA: Diagnosis not present

## 2017-05-29 ENCOUNTER — Ambulatory Visit (INDEPENDENT_AMBULATORY_CARE_PROVIDER_SITE_OTHER): Payer: Medicare Other | Admitting: Cardiovascular Disease

## 2017-05-29 ENCOUNTER — Encounter: Payer: Self-pay | Admitting: Cardiovascular Disease

## 2017-05-29 VITALS — BP 128/70 | HR 72 | Ht 69.5 in | Wt 254.0 lb

## 2017-05-29 DIAGNOSIS — I4510 Unspecified right bundle-branch block: Secondary | ICD-10-CM | POA: Diagnosis not present

## 2017-05-29 DIAGNOSIS — R942 Abnormal results of pulmonary function studies: Secondary | ICD-10-CM | POA: Diagnosis not present

## 2017-05-29 DIAGNOSIS — I119 Hypertensive heart disease without heart failure: Secondary | ICD-10-CM

## 2017-05-29 DIAGNOSIS — R0609 Other forms of dyspnea: Secondary | ICD-10-CM | POA: Diagnosis not present

## 2017-05-29 DIAGNOSIS — E785 Hyperlipidemia, unspecified: Secondary | ICD-10-CM | POA: Diagnosis not present

## 2017-05-29 DIAGNOSIS — I10 Essential (primary) hypertension: Secondary | ICD-10-CM
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
## 2017-05-29 NOTE — Patient Instructions (Signed)

## 2017-06-05 DIAGNOSIS — Z87891 Personal history of nicotine dependence: Secondary | ICD-10-CM | POA: Diagnosis not present

## 2017-06-05 DIAGNOSIS — F068 Other specified mental disorders due to known physiological condition: Secondary | ICD-10-CM | POA: Diagnosis not present

## 2017-06-05 DIAGNOSIS — G3184 Mild cognitive impairment, so stated: Secondary | ICD-10-CM | POA: Diagnosis not present

## 2017-06-05 DIAGNOSIS — R531 Weakness: Secondary | ICD-10-CM | POA: Diagnosis not present

## 2017-06-05 DIAGNOSIS — R7871 Abnormal lead level in blood: Secondary | ICD-10-CM | POA: Diagnosis not present

## 2017-06-05 DIAGNOSIS — R799 Abnormal finding of blood chemistry, unspecified: Secondary | ICD-10-CM | POA: Diagnosis not present

## 2017-06-25 ENCOUNTER — Other Ambulatory Visit: Payer: Self-pay | Admitting: *Deleted

## 2017-06-25 DIAGNOSIS — C61 Malignant neoplasm of prostate: Secondary | ICD-10-CM | POA: Diagnosis not present

## 2017-06-25 DIAGNOSIS — E291 Testicular hypofunction: Secondary | ICD-10-CM | POA: Diagnosis not present

## 2017-06-25 MED ORDER — AMLODIPINE BESYLATE 5 MG PO TABS: 5.0000 mg | ORAL_TABLET | Freq: Every day | ORAL | 3 refills | Status: DC

## 2017-06-27 DIAGNOSIS — I10 Essential (primary) hypertension: Secondary | ICD-10-CM | POA: Diagnosis not present

## 2017-06-27 DIAGNOSIS — R0602 Shortness of breath: Secondary | ICD-10-CM | POA: Diagnosis not present

## 2017-06-27 DIAGNOSIS — C61 Malignant neoplasm of prostate: Secondary | ICD-10-CM | POA: Diagnosis not present

## 2017-06-27 DIAGNOSIS — K210 Gastro-esophageal reflux disease with esophagitis: Secondary | ICD-10-CM | POA: Diagnosis not present

## 2017-07-04 ENCOUNTER — Other Ambulatory Visit (HOSPITAL_COMMUNITY): Payer: Self-pay | Admitting: Pulmonary Disease

## 2017-07-04 DIAGNOSIS — J984 Other disorders of lung: Secondary | ICD-10-CM

## 2017-07-05 DIAGNOSIS — G3184 Mild cognitive impairment, so stated: Secondary | ICD-10-CM | POA: Diagnosis not present

## 2017-07-05 DIAGNOSIS — Z87891 Personal history of nicotine dependence: Secondary | ICD-10-CM | POA: Diagnosis not present

## 2017-07-12 ENCOUNTER — Ambulatory Visit (HOSPITAL_COMMUNITY)
Admission: RE | Admit: 2017-07-12 | Discharge: 2017-07-12 | Disposition: A | Discharge status: Home / Self Care | Payer: Medicare Other | Source: Ambulatory Visit | Attending: Pulmonary Disease | Admitting: Pulmonary Disease

## 2017-07-12 DIAGNOSIS — I700 Atherosclerosis of aorta: Secondary | ICD-10-CM | POA: Insufficient documentation

## 2017-07-12 DIAGNOSIS — J189 Pneumonia, unspecified organism: Secondary | ICD-10-CM | POA: Diagnosis not present

## 2017-07-12 DIAGNOSIS — J984 Other disorders of lung: Secondary | ICD-10-CM | POA: Insufficient documentation

## 2017-07-12 DIAGNOSIS — I2510 Atherosclerotic heart disease of native coronary artery without angina pectoris: Secondary | ICD-10-CM | POA: Insufficient documentation

## 2017-08-02 DIAGNOSIS — R419 Unspecified symptoms and signs involving cognitive functions and awareness: Secondary | ICD-10-CM | POA: Diagnosis not present

## 2017-08-02 DIAGNOSIS — R413 Other amnesia: Secondary | ICD-10-CM | POA: Diagnosis not present

## 2017-08-02 DIAGNOSIS — C61 Malignant neoplasm of prostate: Secondary | ICD-10-CM | POA: Diagnosis not present

## 2017-08-02 DIAGNOSIS — I10 Essential (primary) hypertension: Secondary | ICD-10-CM | POA: Diagnosis not present

## 2017-08-02 DIAGNOSIS — G3184 Mild cognitive impairment, so stated: Secondary | ICD-10-CM | POA: Diagnosis not present

## 2017-08-08 DIAGNOSIS — I10 Essential (primary) hypertension: Secondary | ICD-10-CM | POA: Diagnosis not present

## 2017-08-08 DIAGNOSIS — J849 Interstitial pulmonary disease, unspecified: Secondary | ICD-10-CM | POA: Diagnosis not present

## 2017-08-08 DIAGNOSIS — I700 Atherosclerosis of aorta: Secondary | ICD-10-CM | POA: Diagnosis not present

## 2017-09-27 DIAGNOSIS — J0110 Acute frontal sinusitis, unspecified: Secondary | ICD-10-CM | POA: Diagnosis not present

## 2017-11-01 ENCOUNTER — Other Ambulatory Visit: Payer: Self-pay | Admitting: Cardiovascular Disease

## 2017-11-08 DIAGNOSIS — R9721 Rising PSA following treatment for malignant neoplasm of prostate: Secondary | ICD-10-CM | POA: Diagnosis not present

## 2017-11-08 DIAGNOSIS — Z192 Hormone resistant malignancy status: Secondary | ICD-10-CM | POA: Diagnosis not present

## 2017-11-08 DIAGNOSIS — Z923 Personal history of irradiation: Secondary | ICD-10-CM | POA: Diagnosis not present

## 2017-11-08 DIAGNOSIS — R413 Other amnesia: Secondary | ICD-10-CM | POA: Diagnosis not present

## 2017-11-08 DIAGNOSIS — Z79818 Long term (current) use of other agents affecting estrogen receptors and estrogen levels: Secondary | ICD-10-CM | POA: Diagnosis not present

## 2017-11-08 DIAGNOSIS — C61 Malignant neoplasm of prostate: Secondary | ICD-10-CM | POA: Diagnosis not present

## 2017-11-08 DIAGNOSIS — Z79899 Other long term (current) drug therapy: Secondary | ICD-10-CM | POA: Diagnosis not present

## 2017-11-08 DIAGNOSIS — R5383 Other fatigue: Secondary | ICD-10-CM | POA: Diagnosis not present

## 2017-11-08 DIAGNOSIS — R590 Localized enlarged lymph nodes: Secondary | ICD-10-CM | POA: Diagnosis not present

## 2017-11-08 DIAGNOSIS — G629 Polyneuropathy, unspecified: Secondary | ICD-10-CM | POA: Diagnosis not present

## 2017-11-08 DIAGNOSIS — I10 Essential (primary) hypertension: Secondary | ICD-10-CM | POA: Diagnosis not present

## 2017-11-08 DIAGNOSIS — Z87891 Personal history of nicotine dependence: Secondary | ICD-10-CM | POA: Diagnosis not present

## 2017-11-08 DIAGNOSIS — Z809 Family history of malignant neoplasm, unspecified: Secondary | ICD-10-CM | POA: Diagnosis not present

## 2017-11-08 DIAGNOSIS — R4189 Other symptoms and signs involving cognitive functions and awareness: Secondary | ICD-10-CM | POA: Diagnosis not present

## 2017-12-04 ENCOUNTER — Other Ambulatory Visit: Payer: Self-pay | Admitting: Cardiovascular Disease

## 2017-12-04 MED ORDER — PRAVASTATIN SODIUM 40 MG PO TABS: 40.0000 mg | ORAL_TABLET | Freq: Every evening | ORAL | 0 refills | Status: DC

## 2017-12-04 NOTE — Telephone Encounter (Signed)
°*  STAT* If patient is at the pharmacy, call can be transferred to refill team.   1. Which medications need to be refilled? pravastatin (PRAVACHOL) 40 MG tablet    2. Which pharmacy/location (including street and city if local pharmacy) is medication to be sent to? [facility]  3. Do they need a 30 day or 90 day supply?

## 2017-12-04 NOTE — Telephone Encounter (Signed)
Medication sent to pharmacy  

## 2017-12-28 DIAGNOSIS — C61 Malignant neoplasm of prostate: Secondary | ICD-10-CM | POA: Diagnosis not present

## 2018-01-02 DIAGNOSIS — R5383 Other fatigue: Secondary | ICD-10-CM | POA: Diagnosis not present

## 2018-01-02 DIAGNOSIS — Z0000 Encounter for general adult medical examination without abnormal findings: Secondary | ICD-10-CM | POA: Diagnosis not present

## 2018-01-02 DIAGNOSIS — Z13220 Encounter for screening for lipoid disorders: Secondary | ICD-10-CM | POA: Diagnosis not present

## 2018-01-02 DIAGNOSIS — Z130 Encounter for screening for diseases of the blood and blood-forming organs and certain disorders involving the immune mechanism: Secondary | ICD-10-CM | POA: Diagnosis not present

## 2018-01-02 DIAGNOSIS — Z136 Encounter for screening for cardiovascular disorders: Secondary | ICD-10-CM | POA: Diagnosis not present

## 2018-01-02 DIAGNOSIS — R7302 Impaired glucose tolerance (oral): Secondary | ICD-10-CM | POA: Diagnosis not present

## 2018-01-03 ENCOUNTER — Other Ambulatory Visit (INDEPENDENT_AMBULATORY_CARE_PROVIDER_SITE_OTHER): Payer: Self-pay | Admitting: Internal Medicine

## 2018-01-03 DIAGNOSIS — K219 Gastro-esophageal reflux disease without esophagitis: Secondary | ICD-10-CM

## 2018-01-03 DIAGNOSIS — R1310 Dysphagia, unspecified: Secondary | ICD-10-CM

## 2018-01-15 DIAGNOSIS — R413 Other amnesia: Secondary | ICD-10-CM | POA: Diagnosis not present

## 2018-01-15 DIAGNOSIS — C61 Malignant neoplasm of prostate: Secondary | ICD-10-CM | POA: Diagnosis not present

## 2018-01-15 DIAGNOSIS — Z79899 Other long term (current) drug therapy: Secondary | ICD-10-CM | POA: Diagnosis not present

## 2018-01-15 DIAGNOSIS — R5383 Other fatigue: Secondary | ICD-10-CM | POA: Diagnosis not present

## 2018-01-15 DIAGNOSIS — R4189 Other symptoms and signs involving cognitive functions and awareness: Secondary | ICD-10-CM | POA: Diagnosis not present

## 2018-01-15 DIAGNOSIS — Z888 Allergy status to other drugs, medicaments and biological substances status: Secondary | ICD-10-CM | POA: Diagnosis not present

## 2018-01-15 DIAGNOSIS — I10 Essential (primary) hypertension: Secondary | ICD-10-CM | POA: Diagnosis not present

## 2018-01-15 DIAGNOSIS — G3184 Mild cognitive impairment, so stated: Secondary | ICD-10-CM | POA: Diagnosis not present

## 2018-01-15 DIAGNOSIS — F39 Unspecified mood [affective] disorder: Secondary | ICD-10-CM | POA: Diagnosis not present

## 2018-01-22 DIAGNOSIS — R262 Difficulty in walking, not elsewhere classified: Secondary | ICD-10-CM | POA: Diagnosis not present

## 2018-01-22 DIAGNOSIS — M6281 Muscle weakness (generalized): Secondary | ICD-10-CM | POA: Diagnosis not present

## 2018-01-22 DIAGNOSIS — Z9181 History of falling: Secondary | ICD-10-CM | POA: Diagnosis not present

## 2018-01-24 DIAGNOSIS — R262 Difficulty in walking, not elsewhere classified: Secondary | ICD-10-CM | POA: Diagnosis not present

## 2018-01-24 DIAGNOSIS — M6281 Muscle weakness (generalized): Secondary | ICD-10-CM | POA: Diagnosis not present

## 2018-01-24 DIAGNOSIS — Z9181 History of falling: Secondary | ICD-10-CM | POA: Diagnosis not present

## 2018-01-25 DIAGNOSIS — R262 Difficulty in walking, not elsewhere classified: Secondary | ICD-10-CM | POA: Diagnosis not present

## 2018-01-25 DIAGNOSIS — M6281 Muscle weakness (generalized): Secondary | ICD-10-CM | POA: Diagnosis not present

## 2018-01-25 DIAGNOSIS — Z9181 History of falling: Secondary | ICD-10-CM | POA: Diagnosis not present

## 2018-01-29 DIAGNOSIS — M6281 Muscle weakness (generalized): Secondary | ICD-10-CM | POA: Diagnosis not present

## 2018-01-29 DIAGNOSIS — R262 Difficulty in walking, not elsewhere classified: Secondary | ICD-10-CM | POA: Diagnosis not present

## 2018-01-29 DIAGNOSIS — Z9181 History of falling: Secondary | ICD-10-CM | POA: Diagnosis not present

## 2018-02-01 DIAGNOSIS — M6281 Muscle weakness (generalized): Secondary | ICD-10-CM | POA: Diagnosis not present

## 2018-02-01 DIAGNOSIS — Z9181 History of falling: Secondary | ICD-10-CM | POA: Diagnosis not present

## 2018-02-01 DIAGNOSIS — R262 Difficulty in walking, not elsewhere classified: Secondary | ICD-10-CM | POA: Diagnosis not present

## 2018-02-04 DIAGNOSIS — Z9181 History of falling: Secondary | ICD-10-CM | POA: Diagnosis not present

## 2018-02-04 DIAGNOSIS — R262 Difficulty in walking, not elsewhere classified: Secondary | ICD-10-CM | POA: Diagnosis not present

## 2018-02-04 DIAGNOSIS — M6281 Muscle weakness (generalized): Secondary | ICD-10-CM | POA: Diagnosis not present

## 2018-02-05 DIAGNOSIS — J449 Chronic obstructive pulmonary disease, unspecified: Secondary | ICD-10-CM | POA: Diagnosis not present

## 2018-02-05 DIAGNOSIS — J849 Interstitial pulmonary disease, unspecified: Secondary | ICD-10-CM | POA: Diagnosis not present

## 2018-02-05 DIAGNOSIS — I10 Essential (primary) hypertension: Secondary | ICD-10-CM | POA: Diagnosis not present

## 2018-02-05 DIAGNOSIS — J301 Allergic rhinitis due to pollen: Secondary | ICD-10-CM | POA: Diagnosis not present

## 2018-02-08 DIAGNOSIS — R262 Difficulty in walking, not elsewhere classified: Secondary | ICD-10-CM | POA: Diagnosis not present

## 2018-02-08 DIAGNOSIS — Z9181 History of falling: Secondary | ICD-10-CM | POA: Diagnosis not present

## 2018-02-08 DIAGNOSIS — M6281 Muscle weakness (generalized): Secondary | ICD-10-CM | POA: Diagnosis not present

## 2018-02-12 DIAGNOSIS — R262 Difficulty in walking, not elsewhere classified: Secondary | ICD-10-CM | POA: Diagnosis not present

## 2018-02-12 DIAGNOSIS — M6281 Muscle weakness (generalized): Secondary | ICD-10-CM | POA: Diagnosis not present

## 2018-02-12 DIAGNOSIS — Z9181 History of falling: Secondary | ICD-10-CM | POA: Diagnosis not present

## 2018-02-15 DIAGNOSIS — Z9181 History of falling: Secondary | ICD-10-CM | POA: Diagnosis not present

## 2018-02-15 DIAGNOSIS — R262 Difficulty in walking, not elsewhere classified: Secondary | ICD-10-CM | POA: Diagnosis not present

## 2018-02-15 DIAGNOSIS — M6281 Muscle weakness (generalized): Secondary | ICD-10-CM | POA: Diagnosis not present

## 2018-02-19 DIAGNOSIS — M6281 Muscle weakness (generalized): Secondary | ICD-10-CM | POA: Diagnosis not present

## 2018-02-19 DIAGNOSIS — Z9181 History of falling: Secondary | ICD-10-CM | POA: Diagnosis not present

## 2018-02-19 DIAGNOSIS — R262 Difficulty in walking, not elsewhere classified: Secondary | ICD-10-CM | POA: Diagnosis not present

## 2018-02-21 DIAGNOSIS — Z9181 History of falling: Secondary | ICD-10-CM | POA: Diagnosis not present

## 2018-02-21 DIAGNOSIS — M6281 Muscle weakness (generalized): Secondary | ICD-10-CM | POA: Diagnosis not present

## 2018-02-21 DIAGNOSIS — R262 Difficulty in walking, not elsewhere classified: Secondary | ICD-10-CM | POA: Diagnosis not present

## 2018-02-26 DIAGNOSIS — Z9181 History of falling: Secondary | ICD-10-CM | POA: Diagnosis not present

## 2018-02-26 DIAGNOSIS — M6281 Muscle weakness (generalized): Secondary | ICD-10-CM | POA: Diagnosis not present

## 2018-02-26 DIAGNOSIS — R262 Difficulty in walking, not elsewhere classified: Secondary | ICD-10-CM | POA: Diagnosis not present

## 2018-02-28 DIAGNOSIS — Z192 Hormone resistant malignancy status: Secondary | ICD-10-CM | POA: Diagnosis not present

## 2018-02-28 DIAGNOSIS — C61 Malignant neoplasm of prostate: Secondary | ICD-10-CM | POA: Diagnosis not present

## 2018-02-28 DIAGNOSIS — R413 Other amnesia: Secondary | ICD-10-CM | POA: Diagnosis not present

## 2018-02-28 DIAGNOSIS — I10 Essential (primary) hypertension: Secondary | ICD-10-CM | POA: Diagnosis not present

## 2018-03-01 ENCOUNTER — Encounter (HOSPITAL_COMMUNITY): Payer: Self-pay | Admitting: Emergency Medicine

## 2018-03-01 ENCOUNTER — Emergency Department (HOSPITAL_COMMUNITY): Payer: Medicare Other

## 2018-03-01 ENCOUNTER — Emergency Department (HOSPITAL_COMMUNITY)
Admission: EM | Admit: 2018-03-01 | Discharge: 2018-03-01 | Disposition: A | Discharge status: Home / Self Care | Payer: Medicare Other | Attending: Emergency Medicine | Admitting: Emergency Medicine

## 2018-03-01 ENCOUNTER — Other Ambulatory Visit: Payer: Self-pay

## 2018-03-01 DIAGNOSIS — I10 Essential (primary) hypertension: Secondary | ICD-10-CM | POA: Insufficient documentation

## 2018-03-01 DIAGNOSIS — Z87891 Personal history of nicotine dependence: Secondary | ICD-10-CM | POA: Diagnosis not present

## 2018-03-01 DIAGNOSIS — Z79899 Other long term (current) drug therapy: Secondary | ICD-10-CM | POA: Diagnosis not present

## 2018-03-01 DIAGNOSIS — R112 Nausea with vomiting, unspecified: Secondary | ICD-10-CM | POA: Diagnosis not present

## 2018-03-01 DIAGNOSIS — R1013 Epigastric pain: Secondary | ICD-10-CM | POA: Diagnosis not present

## 2018-03-01 DIAGNOSIS — K449 Diaphragmatic hernia without obstruction or gangrene: Secondary | ICD-10-CM | POA: Diagnosis not present

## 2018-03-01 DIAGNOSIS — R0789 Other chest pain: Secondary | ICD-10-CM | POA: Diagnosis not present

## 2018-03-01 DIAGNOSIS — R079 Chest pain, unspecified: Secondary | ICD-10-CM | POA: Diagnosis not present

## 2018-03-01 LAB — CBC WITH DIFFERENTIAL/PLATELET
Basophils Absolute: 0 10*3/uL (ref 0.0–0.1)
Basophils Relative: 0 %
Eosinophils Absolute: 0.1 10*3/uL (ref 0.0–0.7)
Eosinophils Relative: 1 %
HEMATOCRIT: 40.4 % (ref 39.0–52.0)
HEMOGLOBIN: 13.9 g/dL (ref 13.0–17.0)
LYMPHS ABS: 1.9 10*3/uL (ref 0.7–4.0)
Lymphocytes Relative: 18 %
MCH: 32.5 pg (ref 26.0–34.0)
MCHC: 34.4 g/dL (ref 30.0–36.0)
MCV: 94.4 fL (ref 78.0–100.0)
Monocytes Absolute: 0.7 10*3/uL (ref 0.1–1.0)
Monocytes Relative: 7 %
Neutro Abs: 7.7 10*3/uL (ref 1.7–7.7)
Neutrophils Relative %: 74 %
Platelets: 160 10*3/uL (ref 150–400)
RBC: 4.28 MIL/uL (ref 4.22–5.81)
RDW: 12.7 % (ref 11.5–15.5)
WBC: 10.3 10*3/uL (ref 4.0–10.5)

## 2018-03-01 LAB — COMPREHENSIVE METABOLIC PANEL
ALK PHOS: 65 U/L (ref 38–126)
ALT: 14 U/L (ref 0–44)
AST: 19 U/L (ref 15–41)
Albumin: 4.1 g/dL (ref 3.5–5.0)
Anion gap: 10 (ref 5–15)
BILIRUBIN TOTAL: 0.7 mg/dL (ref 0.3–1.2)
BUN: 24 mg/dL — ABNORMAL HIGH (ref 8–23)
CALCIUM: 9.2 mg/dL (ref 8.9–10.3)
CO2: 27 mmol/L (ref 22–32)
CREATININE: 1.35 mg/dL — AB (ref 0.61–1.24)
Chloride: 99 mmol/L (ref 98–111)
GFR, EST AFRICAN AMERICAN: 54 mL/min — AB (ref >60)
GFR, EST NON AFRICAN AMERICAN: 47 mL/min — AB (ref >60)
Glucose, Bld: 164 mg/dL — ABNORMAL HIGH (ref 70–99)
Potassium: 3.6 mmol/L (ref 3.5–5.1)
Sodium: 136 mmol/L (ref 135–145)
TOTAL PROTEIN: 7.7 g/dL (ref 6.5–8.1)

## 2018-03-01 LAB — CBG MONITORING, ED: GLUCOSE-CAPILLARY: 157 mg/dL — AB (ref 70–99)

## 2018-03-01 LAB — TROPONIN I: Troponin I: <0.03 ng/mL (ref <0.03)

## 2018-03-01 LAB — I-STAT TROPONIN, ED: TROPONIN I, POC: 0 ng/mL (ref 0.00–0.08)

## 2018-03-01 LAB — LIPASE, BLOOD: Lipase: 36 U/L (ref 11–51)

## 2018-03-01 MED ORDER — ONDANSETRON HCL 4 MG/2ML IJ SOLN
INTRAMUSCULAR | Status: AC
  Filled 2018-03-01: qty 2

## 2018-03-01 MED ORDER — IOPAMIDOL (ISOVUE-300) INJECTION 61%
100.0000 mL | Freq: Once | INTRAVENOUS | Status: AC | PRN
  Administered 2018-03-01: 80 mL via INTRAVENOUS

## 2018-03-01 MED ORDER — ONDANSETRON HCL 4 MG/2ML IJ SOLN
4.0000 mg | Freq: Once | INTRAMUSCULAR | Status: AC
  Administered 2018-03-01: 4 mg via INTRAVENOUS

## 2018-03-01 MED ORDER — SODIUM CHLORIDE 0.9 % IV BOLUS
500.0000 mL | Freq: Once | INTRAVENOUS | Status: AC
Start: 2018-03-01 — End: 2018-03-01
  Administered 2018-03-01: 500 mL via INTRAVENOUS

## 2018-03-01 NOTE — ED Notes (Signed)
Pt returned from Ultrasound.

## 2018-03-01 NOTE — ED Notes (Signed)
Pt ambulatory to the bathroom 

## 2018-03-01 NOTE — ED Triage Notes (Signed)
Pt c/o chest/epigastric pain with vomiting x 2 hours.

## 2018-03-01 NOTE — ED Notes (Signed)
Patient transported to X-ray 
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
## 2018-03-04 ENCOUNTER — Ambulatory Visit (INDEPENDENT_AMBULATORY_CARE_PROVIDER_SITE_OTHER): Payer: Medicare Other | Admitting: Cardiovascular Disease

## 2018-03-04 ENCOUNTER — Other Ambulatory Visit: Payer: Self-pay | Admitting: Cardiovascular Disease

## 2018-03-04 ENCOUNTER — Encounter: Payer: Self-pay | Admitting: Cardiovascular Disease

## 2018-03-04 VITALS — BP 130/72 | HR 73 | Ht 68.0 in | Wt 249.0 lb

## 2018-03-04 DIAGNOSIS — E785 Hyperlipidemia, unspecified: Secondary | ICD-10-CM | POA: Diagnosis not present

## 2018-03-04 DIAGNOSIS — I10 Essential (primary) hypertension: Secondary | ICD-10-CM | POA: Diagnosis not present

## 2018-03-04 DIAGNOSIS — I119 Hypertensive heart disease without heart failure: Secondary | ICD-10-CM | POA: Diagnosis not present

## 2018-03-04 NOTE — Patient Instructions (Signed)
Medication Instructions:  Continue all current medications.  Labwork: none  Testing/Procedures: none  Follow-Up: As needed.    Any Other Special Instructions Will Be Listed Below (If Applicable).  If you need a refill on your cardiac medications before your next appointment, please call your pharmacy.  
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
## 2018-03-05 DIAGNOSIS — R262 Difficulty in walking, not elsewhere classified: Secondary | ICD-10-CM | POA: Diagnosis not present

## 2018-03-05 DIAGNOSIS — M6281 Muscle weakness (generalized): Secondary | ICD-10-CM | POA: Diagnosis not present

## 2018-03-05 DIAGNOSIS — Z9181 History of falling: Secondary | ICD-10-CM | POA: Diagnosis not present

## 2018-03-08 DIAGNOSIS — Z9181 History of falling: Secondary | ICD-10-CM | POA: Diagnosis not present

## 2018-03-08 DIAGNOSIS — R262 Difficulty in walking, not elsewhere classified: Secondary | ICD-10-CM | POA: Diagnosis not present

## 2018-03-08 DIAGNOSIS — M6281 Muscle weakness (generalized): Secondary | ICD-10-CM | POA: Diagnosis not present

## 2018-03-15 DIAGNOSIS — M6281 Muscle weakness (generalized): Secondary | ICD-10-CM | POA: Diagnosis not present

## 2018-03-15 DIAGNOSIS — R262 Difficulty in walking, not elsewhere classified: Secondary | ICD-10-CM | POA: Diagnosis not present

## 2018-03-15 DIAGNOSIS — Z9181 History of falling: Secondary | ICD-10-CM | POA: Diagnosis not present

## 2018-03-19 DIAGNOSIS — M6281 Muscle weakness (generalized): Secondary | ICD-10-CM | POA: Diagnosis not present

## 2018-03-19 DIAGNOSIS — Z9181 History of falling: Secondary | ICD-10-CM | POA: Diagnosis not present

## 2018-03-19 DIAGNOSIS — R262 Difficulty in walking, not elsewhere classified: Secondary | ICD-10-CM | POA: Diagnosis not present

## 2018-03-22 DIAGNOSIS — R262 Difficulty in walking, not elsewhere classified: Secondary | ICD-10-CM | POA: Diagnosis not present

## 2018-03-22 DIAGNOSIS — M6281 Muscle weakness (generalized): Secondary | ICD-10-CM | POA: Diagnosis not present

## 2018-03-22 DIAGNOSIS — Z9181 History of falling: Secondary | ICD-10-CM | POA: Diagnosis not present

## 2018-03-26 DIAGNOSIS — M6281 Muscle weakness (generalized): Secondary | ICD-10-CM | POA: Diagnosis not present

## 2018-03-26 DIAGNOSIS — R262 Difficulty in walking, not elsewhere classified: Secondary | ICD-10-CM | POA: Diagnosis not present

## 2018-03-26 DIAGNOSIS — Z9181 History of falling: Secondary | ICD-10-CM | POA: Diagnosis not present

## 2018-03-29 DIAGNOSIS — Z9181 History of falling: Secondary | ICD-10-CM | POA: Diagnosis not present

## 2018-03-29 DIAGNOSIS — R262 Difficulty in walking, not elsewhere classified: Secondary | ICD-10-CM | POA: Diagnosis not present

## 2018-03-29 DIAGNOSIS — M6281 Muscle weakness (generalized): Secondary | ICD-10-CM | POA: Diagnosis not present

## 2018-04-03 DIAGNOSIS — M6281 Muscle weakness (generalized): Secondary | ICD-10-CM | POA: Diagnosis not present

## 2018-04-03 DIAGNOSIS — Z9181 History of falling: Secondary | ICD-10-CM | POA: Diagnosis not present

## 2018-04-03 DIAGNOSIS — R262 Difficulty in walking, not elsewhere classified: Secondary | ICD-10-CM | POA: Diagnosis not present

## 2018-04-09 DIAGNOSIS — G629 Polyneuropathy, unspecified: Secondary | ICD-10-CM | POA: Diagnosis not present

## 2018-04-09 DIAGNOSIS — I10 Essential (primary) hypertension: Secondary | ICD-10-CM | POA: Diagnosis not present

## 2018-04-09 DIAGNOSIS — R9721 Rising PSA following treatment for malignant neoplasm of prostate: Secondary | ICD-10-CM | POA: Diagnosis not present

## 2018-04-09 DIAGNOSIS — C61 Malignant neoplasm of prostate: Secondary | ICD-10-CM | POA: Diagnosis not present

## 2018-04-09 DIAGNOSIS — Z79899 Other long term (current) drug therapy: Secondary | ICD-10-CM | POA: Diagnosis not present

## 2018-04-11 DIAGNOSIS — M6281 Muscle weakness (generalized): Secondary | ICD-10-CM | POA: Diagnosis not present

## 2018-04-11 DIAGNOSIS — R262 Difficulty in walking, not elsewhere classified: Secondary | ICD-10-CM | POA: Diagnosis not present

## 2018-04-11 DIAGNOSIS — Z9181 History of falling: Secondary | ICD-10-CM | POA: Diagnosis not present

## 2018-04-18 ENCOUNTER — Ambulatory Visit (INDEPENDENT_AMBULATORY_CARE_PROVIDER_SITE_OTHER): Payer: Medicare Other | Admitting: Internal Medicine

## 2018-04-18 ENCOUNTER — Encounter (INDEPENDENT_AMBULATORY_CARE_PROVIDER_SITE_OTHER): Payer: Self-pay | Admitting: Internal Medicine

## 2018-04-18 ENCOUNTER — Encounter (INDEPENDENT_AMBULATORY_CARE_PROVIDER_SITE_OTHER): Payer: Self-pay | Admitting: *Deleted

## 2018-04-18 DIAGNOSIS — R1011 Right upper quadrant pain: Secondary | ICD-10-CM | POA: Diagnosis not present

## 2018-04-18 NOTE — Patient Instructions (Signed)
HIDA scan.
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
## 2018-04-23 ENCOUNTER — Encounter (HOSPITAL_COMMUNITY): Payer: Self-pay

## 2018-04-23 ENCOUNTER — Encounter (HOSPITAL_COMMUNITY)
Admission: RE | Admit: 2018-04-23 | Discharge: 2018-04-23 | Disposition: A | Discharge status: Home / Self Care | Payer: Medicare Other | Source: Ambulatory Visit | Attending: Internal Medicine | Admitting: Internal Medicine

## 2018-04-23 DIAGNOSIS — R948 Abnormal results of function studies of other organs and systems: Secondary | ICD-10-CM | POA: Diagnosis not present

## 2018-04-23 DIAGNOSIS — R1011 Right upper quadrant pain: Secondary | ICD-10-CM | POA: Insufficient documentation

## 2018-04-23 MED ORDER — TECHNETIUM TC 99M MEBROFENIN IV KIT
5.0000 | PACK | Freq: Once | INTRAVENOUS | Status: AC | PRN
  Administered 2018-04-23: 5.5 via INTRAVENOUS

## 2018-04-24 ENCOUNTER — Telehealth (INDEPENDENT_AMBULATORY_CARE_PROVIDER_SITE_OTHER): Payer: Self-pay | Admitting: Internal Medicine

## 2018-04-24 DIAGNOSIS — R948 Abnormal results of function studies of other organs and systems: Secondary | ICD-10-CM

## 2018-04-24 NOTE — Telephone Encounter (Signed)
I have put referral in to Dr. Arnoldo Morale,.

## 2018-04-24 NOTE — Telephone Encounter (Signed)
Please call patient regarding surgery - ph# [phone]

## 2018-04-24 NOTE — Telephone Encounter (Signed)
I have spoken with wife. Referral to Dr. Arnoldo Morale

## 2018-04-25 ENCOUNTER — Telehealth (INDEPENDENT_AMBULATORY_CARE_PROVIDER_SITE_OTHER): Payer: Self-pay | Admitting: Internal Medicine

## 2018-04-25 DIAGNOSIS — E860 Dehydration: Secondary | ICD-10-CM | POA: Diagnosis not present

## 2018-04-25 DIAGNOSIS — C61 Malignant neoplasm of prostate: Secondary | ICD-10-CM | POA: Diagnosis present

## 2018-04-25 DIAGNOSIS — B9620 Unspecified Escherichia coli [E. coli] as the cause of diseases classified elsewhere: Secondary | ICD-10-CM | POA: Diagnosis present

## 2018-04-25 DIAGNOSIS — K5730 Diverticulosis of large intestine without perforation or abscess without bleeding: Secondary | ICD-10-CM | POA: Diagnosis not present

## 2018-04-25 DIAGNOSIS — Z79899 Other long term (current) drug therapy: Secondary | ICD-10-CM | POA: Diagnosis not present

## 2018-04-25 DIAGNOSIS — K3580 Unspecified acute appendicitis: Secondary | ICD-10-CM | POA: Diagnosis not present

## 2018-04-25 DIAGNOSIS — R7881 Bacteremia: Secondary | ICD-10-CM | POA: Diagnosis not present

## 2018-04-25 DIAGNOSIS — K811 Chronic cholecystitis: Secondary | ICD-10-CM | POA: Diagnosis not present

## 2018-04-25 DIAGNOSIS — R079 Chest pain, unspecified: Secondary | ICD-10-CM | POA: Diagnosis not present

## 2018-04-25 DIAGNOSIS — R41841 Cognitive communication deficit: Secondary | ICD-10-CM | POA: Diagnosis not present

## 2018-04-25 DIAGNOSIS — Z6838 Body mass index (BMI) 38.0-38.9, adult: Secondary | ICD-10-CM | POA: Diagnosis not present

## 2018-04-25 DIAGNOSIS — Z23 Encounter for immunization: Secondary | ICD-10-CM | POA: Diagnosis not present

## 2018-04-25 DIAGNOSIS — K567 Ileus, unspecified: Secondary | ICD-10-CM | POA: Diagnosis not present

## 2018-04-25 DIAGNOSIS — R2689 Other abnormalities of gait and mobility: Secondary | ICD-10-CM | POA: Diagnosis not present

## 2018-04-25 DIAGNOSIS — I5031 Acute diastolic (congestive) heart failure: Secondary | ICD-10-CM | POA: Diagnosis not present

## 2018-04-25 DIAGNOSIS — R0989 Other specified symptoms and signs involving the circulatory and respiratory systems: Secondary | ICD-10-CM | POA: Diagnosis not present

## 2018-04-25 DIAGNOSIS — R52 Pain, unspecified: Secondary | ICD-10-CM | POA: Diagnosis not present

## 2018-04-25 DIAGNOSIS — M6281 Muscle weakness (generalized): Secondary | ICD-10-CM | POA: Diagnosis not present

## 2018-04-25 DIAGNOSIS — A419 Sepsis, unspecified organism: Secondary | ICD-10-CM | POA: Diagnosis not present

## 2018-04-25 DIAGNOSIS — K810 Acute cholecystitis: Secondary | ICD-10-CM | POA: Diagnosis present

## 2018-04-25 DIAGNOSIS — E876 Hypokalemia: Secondary | ICD-10-CM | POA: Diagnosis not present

## 2018-04-25 DIAGNOSIS — I4510 Unspecified right bundle-branch block: Secondary | ICD-10-CM | POA: Diagnosis not present

## 2018-04-25 DIAGNOSIS — I5033 Acute on chronic diastolic (congestive) heart failure: Secondary | ICD-10-CM | POA: Diagnosis not present

## 2018-04-25 DIAGNOSIS — I110 Hypertensive heart disease with heart failure: Secondary | ICD-10-CM | POA: Diagnosis present

## 2018-04-25 DIAGNOSIS — Z48815 Encounter for surgical aftercare following surgery on the digestive system: Secondary | ICD-10-CM | POA: Diagnosis not present

## 2018-04-25 DIAGNOSIS — K219 Gastro-esophageal reflux disease without esophagitis: Secondary | ICD-10-CM | POA: Diagnosis present

## 2018-04-25 DIAGNOSIS — I10 Essential (primary) hypertension: Secondary | ICD-10-CM | POA: Diagnosis not present

## 2018-04-25 DIAGNOSIS — R0902 Hypoxemia: Secondary | ICD-10-CM | POA: Diagnosis not present

## 2018-04-25 DIAGNOSIS — R1084 Generalized abdominal pain: Secondary | ICD-10-CM | POA: Diagnosis not present

## 2018-04-25 DIAGNOSIS — D121 Benign neoplasm of appendix: Secondary | ICD-10-CM | POA: Diagnosis not present

## 2018-04-25 DIAGNOSIS — Z4801 Encounter for change or removal of surgical wound dressing: Secondary | ICD-10-CM | POA: Diagnosis not present

## 2018-04-25 DIAGNOSIS — K82A1 Gangrene of gallbladder in cholecystitis: Secondary | ICD-10-CM | POA: Diagnosis present

## 2018-04-25 NOTE — Telephone Encounter (Signed)
Please call Deangleo Passage at [phone] - patient has been vomiting with diarrhea since the test for his gall bladder

## 2018-04-25 NOTE — Telephone Encounter (Signed)
She is going to call PCP or she may take to the ED

## 2018-05-02 DIAGNOSIS — Z23 Encounter for immunization: Secondary | ICD-10-CM | POA: Diagnosis not present

## 2018-05-02 DIAGNOSIS — B9620 Unspecified Escherichia coli [E. coli] as the cause of diseases classified elsewhere: Secondary | ICD-10-CM | POA: Diagnosis not present

## 2018-05-02 DIAGNOSIS — Z4801 Encounter for change or removal of surgical wound dressing: Secondary | ICD-10-CM | POA: Diagnosis not present

## 2018-05-02 DIAGNOSIS — Z48815 Encounter for surgical aftercare following surgery on the digestive system: Secondary | ICD-10-CM | POA: Diagnosis not present

## 2018-05-02 DIAGNOSIS — R41841 Cognitive communication deficit: Secondary | ICD-10-CM | POA: Diagnosis not present

## 2018-05-02 DIAGNOSIS — C61 Malignant neoplasm of prostate: Secondary | ICD-10-CM | POA: Diagnosis not present

## 2018-05-02 DIAGNOSIS — K3580 Unspecified acute appendicitis: Secondary | ICD-10-CM | POA: Diagnosis not present

## 2018-05-02 DIAGNOSIS — I5033 Acute on chronic diastolic (congestive) heart failure: Secondary | ICD-10-CM | POA: Diagnosis not present

## 2018-05-02 DIAGNOSIS — I110 Hypertensive heart disease with heart failure: Secondary | ICD-10-CM | POA: Diagnosis not present

## 2018-05-02 DIAGNOSIS — K915 Postcholecystectomy syndrome: Secondary | ICD-10-CM | POA: Diagnosis not present

## 2018-05-02 DIAGNOSIS — R2689 Other abnormalities of gait and mobility: Secondary | ICD-10-CM | POA: Diagnosis not present

## 2018-05-02 DIAGNOSIS — E876 Hypokalemia: Secondary | ICD-10-CM | POA: Diagnosis not present

## 2018-05-02 DIAGNOSIS — M6281 Muscle weakness (generalized): Secondary | ICD-10-CM | POA: Diagnosis not present

## 2018-05-03 DIAGNOSIS — K3580 Unspecified acute appendicitis: Secondary | ICD-10-CM | POA: Diagnosis not present

## 2018-05-03 DIAGNOSIS — I5033 Acute on chronic diastolic (congestive) heart failure: Secondary | ICD-10-CM | POA: Diagnosis not present

## 2018-05-03 DIAGNOSIS — B9620 Unspecified Escherichia coli [E. coli] as the cause of diseases classified elsewhere: Secondary | ICD-10-CM | POA: Diagnosis not present

## 2018-05-03 DIAGNOSIS — K915 Postcholecystectomy syndrome: Secondary | ICD-10-CM | POA: Diagnosis not present

## 2018-05-22 DIAGNOSIS — Z8546 Personal history of malignant neoplasm of prostate: Secondary | ICD-10-CM | POA: Diagnosis not present

## 2018-05-22 DIAGNOSIS — Z48815 Encounter for surgical aftercare following surgery on the digestive system: Secondary | ICD-10-CM | POA: Diagnosis not present

## 2018-05-22 DIAGNOSIS — I110 Hypertensive heart disease with heart failure: Secondary | ICD-10-CM | POA: Diagnosis not present

## 2018-05-22 DIAGNOSIS — K3580 Unspecified acute appendicitis: Secondary | ICD-10-CM | POA: Diagnosis not present

## 2018-05-22 DIAGNOSIS — E876 Hypokalemia: Secondary | ICD-10-CM | POA: Diagnosis not present

## 2018-05-22 DIAGNOSIS — K219 Gastro-esophageal reflux disease without esophagitis: Secondary | ICD-10-CM | POA: Diagnosis not present

## 2018-05-22 DIAGNOSIS — I5031 Acute diastolic (congestive) heart failure: Secondary | ICD-10-CM | POA: Diagnosis not present

## 2018-05-22 DIAGNOSIS — K810 Acute cholecystitis: Secondary | ICD-10-CM | POA: Diagnosis not present

## 2018-05-23 DIAGNOSIS — Z48815 Encounter for surgical aftercare following surgery on the digestive system: Secondary | ICD-10-CM | POA: Diagnosis not present

## 2018-05-23 DIAGNOSIS — K810 Acute cholecystitis: Secondary | ICD-10-CM | POA: Diagnosis not present

## 2018-05-23 DIAGNOSIS — K3580 Unspecified acute appendicitis: Secondary | ICD-10-CM | POA: Diagnosis not present

## 2018-05-23 DIAGNOSIS — E876 Hypokalemia: Secondary | ICD-10-CM | POA: Diagnosis not present

## 2018-05-23 DIAGNOSIS — I5031 Acute diastolic (congestive) heart failure: Secondary | ICD-10-CM | POA: Diagnosis not present

## 2018-05-23 DIAGNOSIS — I110 Hypertensive heart disease with heart failure: Secondary | ICD-10-CM | POA: Diagnosis not present

## 2018-05-28 DIAGNOSIS — E876 Hypokalemia: Secondary | ICD-10-CM | POA: Diagnosis not present

## 2018-05-28 DIAGNOSIS — I5031 Acute diastolic (congestive) heart failure: Secondary | ICD-10-CM | POA: Diagnosis not present

## 2018-05-28 DIAGNOSIS — K3580 Unspecified acute appendicitis: Secondary | ICD-10-CM | POA: Diagnosis not present

## 2018-05-28 DIAGNOSIS — I110 Hypertensive heart disease with heart failure: Secondary | ICD-10-CM | POA: Diagnosis not present

## 2018-05-28 DIAGNOSIS — Z48815 Encounter for surgical aftercare following surgery on the digestive system: Secondary | ICD-10-CM | POA: Diagnosis not present

## 2018-05-28 DIAGNOSIS — K810 Acute cholecystitis: Secondary | ICD-10-CM | POA: Diagnosis not present

## 2018-05-30 DIAGNOSIS — Z8719 Personal history of other diseases of the digestive system: Secondary | ICD-10-CM | POA: Diagnosis not present

## 2018-05-30 DIAGNOSIS — Z9049 Acquired absence of other specified parts of digestive tract: Secondary | ICD-10-CM | POA: Diagnosis not present

## 2018-05-30 DIAGNOSIS — Z23 Encounter for immunization: Secondary | ICD-10-CM | POA: Diagnosis not present

## 2018-05-30 DIAGNOSIS — Z09 Encounter for follow-up examination after completed treatment for conditions other than malignant neoplasm: Secondary | ICD-10-CM | POA: Diagnosis not present

## 2018-05-31 DIAGNOSIS — Z48815 Encounter for surgical aftercare following surgery on the digestive system: Secondary | ICD-10-CM | POA: Diagnosis not present

## 2018-05-31 DIAGNOSIS — E876 Hypokalemia: Secondary | ICD-10-CM | POA: Diagnosis not present

## 2018-05-31 DIAGNOSIS — I110 Hypertensive heart disease with heart failure: Secondary | ICD-10-CM | POA: Diagnosis not present

## 2018-05-31 DIAGNOSIS — K810 Acute cholecystitis: Secondary | ICD-10-CM | POA: Diagnosis not present

## 2018-05-31 DIAGNOSIS — K3580 Unspecified acute appendicitis: Secondary | ICD-10-CM | POA: Diagnosis not present

## 2018-05-31 DIAGNOSIS — I5031 Acute diastolic (congestive) heart failure: Secondary | ICD-10-CM | POA: Diagnosis not present

## 2018-06-03 DIAGNOSIS — I5031 Acute diastolic (congestive) heart failure: Secondary | ICD-10-CM | POA: Diagnosis not present

## 2018-06-03 DIAGNOSIS — E876 Hypokalemia: Secondary | ICD-10-CM | POA: Diagnosis not present

## 2018-06-03 DIAGNOSIS — K3580 Unspecified acute appendicitis: Secondary | ICD-10-CM | POA: Diagnosis not present

## 2018-06-03 DIAGNOSIS — I110 Hypertensive heart disease with heart failure: Secondary | ICD-10-CM | POA: Diagnosis not present

## 2018-06-03 DIAGNOSIS — K810 Acute cholecystitis: Secondary | ICD-10-CM | POA: Diagnosis not present

## 2018-06-03 DIAGNOSIS — Z48815 Encounter for surgical aftercare following surgery on the digestive system: Secondary | ICD-10-CM | POA: Diagnosis not present

## 2018-06-04 DIAGNOSIS — K3580 Unspecified acute appendicitis: Secondary | ICD-10-CM | POA: Diagnosis not present

## 2018-06-04 DIAGNOSIS — E876 Hypokalemia: Secondary | ICD-10-CM | POA: Diagnosis not present

## 2018-06-04 DIAGNOSIS — I5031 Acute diastolic (congestive) heart failure: Secondary | ICD-10-CM | POA: Diagnosis not present

## 2018-06-04 DIAGNOSIS — Z48815 Encounter for surgical aftercare following surgery on the digestive system: Secondary | ICD-10-CM | POA: Diagnosis not present

## 2018-06-04 DIAGNOSIS — K810 Acute cholecystitis: Secondary | ICD-10-CM | POA: Diagnosis not present

## 2018-06-04 DIAGNOSIS — I110 Hypertensive heart disease with heart failure: Secondary | ICD-10-CM | POA: Diagnosis not present

## 2018-06-05 DIAGNOSIS — I5031 Acute diastolic (congestive) heart failure: Secondary | ICD-10-CM | POA: Diagnosis not present

## 2018-06-05 DIAGNOSIS — E876 Hypokalemia: Secondary | ICD-10-CM | POA: Diagnosis not present

## 2018-06-05 DIAGNOSIS — I110 Hypertensive heart disease with heart failure: Secondary | ICD-10-CM | POA: Diagnosis not present

## 2018-06-05 DIAGNOSIS — K810 Acute cholecystitis: Secondary | ICD-10-CM | POA: Diagnosis not present

## 2018-06-05 DIAGNOSIS — K3580 Unspecified acute appendicitis: Secondary | ICD-10-CM | POA: Diagnosis not present

## 2018-06-05 DIAGNOSIS — Z48815 Encounter for surgical aftercare following surgery on the digestive system: Secondary | ICD-10-CM | POA: Diagnosis not present

## 2018-06-06 DIAGNOSIS — I110 Hypertensive heart disease with heart failure: Secondary | ICD-10-CM | POA: Diagnosis not present

## 2018-06-06 DIAGNOSIS — Z48815 Encounter for surgical aftercare following surgery on the digestive system: Secondary | ICD-10-CM | POA: Diagnosis not present

## 2018-06-06 DIAGNOSIS — K810 Acute cholecystitis: Secondary | ICD-10-CM | POA: Diagnosis not present

## 2018-06-06 DIAGNOSIS — I5031 Acute diastolic (congestive) heart failure: Secondary | ICD-10-CM | POA: Diagnosis not present

## 2018-06-06 DIAGNOSIS — E876 Hypokalemia: Secondary | ICD-10-CM | POA: Diagnosis not present

## 2018-06-06 DIAGNOSIS — K3580 Unspecified acute appendicitis: Secondary | ICD-10-CM | POA: Diagnosis not present

## 2018-06-07 DIAGNOSIS — I110 Hypertensive heart disease with heart failure: Secondary | ICD-10-CM | POA: Diagnosis not present

## 2018-06-07 DIAGNOSIS — E876 Hypokalemia: Secondary | ICD-10-CM | POA: Diagnosis not present

## 2018-06-07 DIAGNOSIS — K3580 Unspecified acute appendicitis: Secondary | ICD-10-CM | POA: Diagnosis not present

## 2018-06-07 DIAGNOSIS — I5031 Acute diastolic (congestive) heart failure: Secondary | ICD-10-CM | POA: Diagnosis not present

## 2018-06-07 DIAGNOSIS — K810 Acute cholecystitis: Secondary | ICD-10-CM | POA: Diagnosis not present

## 2018-06-07 DIAGNOSIS — Z48815 Encounter for surgical aftercare following surgery on the digestive system: Secondary | ICD-10-CM | POA: Diagnosis not present

## 2018-06-10 DIAGNOSIS — E876 Hypokalemia: Secondary | ICD-10-CM | POA: Diagnosis not present

## 2018-06-10 DIAGNOSIS — Z48815 Encounter for surgical aftercare following surgery on the digestive system: Secondary | ICD-10-CM | POA: Diagnosis not present

## 2018-06-10 DIAGNOSIS — I110 Hypertensive heart disease with heart failure: Secondary | ICD-10-CM | POA: Diagnosis not present

## 2018-06-10 DIAGNOSIS — I5031 Acute diastolic (congestive) heart failure: Secondary | ICD-10-CM | POA: Diagnosis not present

## 2018-06-10 DIAGNOSIS — K810 Acute cholecystitis: Secondary | ICD-10-CM | POA: Diagnosis not present

## 2018-06-10 DIAGNOSIS — K3580 Unspecified acute appendicitis: Secondary | ICD-10-CM | POA: Diagnosis not present

## 2018-06-12 DIAGNOSIS — K810 Acute cholecystitis: Secondary | ICD-10-CM | POA: Diagnosis not present

## 2018-06-12 DIAGNOSIS — I5031 Acute diastolic (congestive) heart failure: Secondary | ICD-10-CM | POA: Diagnosis not present

## 2018-06-12 DIAGNOSIS — Z48815 Encounter for surgical aftercare following surgery on the digestive system: Secondary | ICD-10-CM | POA: Diagnosis not present

## 2018-06-12 DIAGNOSIS — I110 Hypertensive heart disease with heart failure: Secondary | ICD-10-CM | POA: Diagnosis not present

## 2018-06-12 DIAGNOSIS — E876 Hypokalemia: Secondary | ICD-10-CM | POA: Diagnosis not present

## 2018-06-12 DIAGNOSIS — K3580 Unspecified acute appendicitis: Secondary | ICD-10-CM | POA: Diagnosis not present

## 2018-06-14 DIAGNOSIS — E876 Hypokalemia: Secondary | ICD-10-CM | POA: Diagnosis not present

## 2018-06-14 DIAGNOSIS — I110 Hypertensive heart disease with heart failure: Secondary | ICD-10-CM | POA: Diagnosis not present

## 2018-06-14 DIAGNOSIS — K3580 Unspecified acute appendicitis: Secondary | ICD-10-CM | POA: Diagnosis not present

## 2018-06-14 DIAGNOSIS — K810 Acute cholecystitis: Secondary | ICD-10-CM | POA: Diagnosis not present

## 2018-06-14 DIAGNOSIS — I5031 Acute diastolic (congestive) heart failure: Secondary | ICD-10-CM | POA: Diagnosis not present

## 2018-06-14 DIAGNOSIS — Z48815 Encounter for surgical aftercare following surgery on the digestive system: Secondary | ICD-10-CM | POA: Diagnosis not present

## 2018-06-18 DIAGNOSIS — E876 Hypokalemia: Secondary | ICD-10-CM | POA: Diagnosis not present

## 2018-06-18 DIAGNOSIS — Z48815 Encounter for surgical aftercare following surgery on the digestive system: Secondary | ICD-10-CM | POA: Diagnosis not present

## 2018-06-18 DIAGNOSIS — K3580 Unspecified acute appendicitis: Secondary | ICD-10-CM | POA: Diagnosis not present

## 2018-06-18 DIAGNOSIS — I5031 Acute diastolic (congestive) heart failure: Secondary | ICD-10-CM | POA: Diagnosis not present

## 2018-06-18 DIAGNOSIS — K810 Acute cholecystitis: Secondary | ICD-10-CM | POA: Diagnosis not present

## 2018-06-18 DIAGNOSIS — I110 Hypertensive heart disease with heart failure: Secondary | ICD-10-CM | POA: Diagnosis not present

## 2018-06-19 DIAGNOSIS — E876 Hypokalemia: Secondary | ICD-10-CM | POA: Diagnosis not present

## 2018-06-19 DIAGNOSIS — Z48815 Encounter for surgical aftercare following surgery on the digestive system: Secondary | ICD-10-CM | POA: Diagnosis not present

## 2018-06-19 DIAGNOSIS — K810 Acute cholecystitis: Secondary | ICD-10-CM | POA: Diagnosis not present

## 2018-06-19 DIAGNOSIS — I5031 Acute diastolic (congestive) heart failure: Secondary | ICD-10-CM | POA: Diagnosis not present

## 2018-06-19 DIAGNOSIS — I110 Hypertensive heart disease with heart failure: Secondary | ICD-10-CM | POA: Diagnosis not present

## 2018-06-19 DIAGNOSIS — K3580 Unspecified acute appendicitis: Secondary | ICD-10-CM | POA: Diagnosis not present

## 2018-06-21 DIAGNOSIS — K3580 Unspecified acute appendicitis: Secondary | ICD-10-CM | POA: Diagnosis not present

## 2018-06-21 DIAGNOSIS — I5031 Acute diastolic (congestive) heart failure: Secondary | ICD-10-CM | POA: Diagnosis not present

## 2018-06-21 DIAGNOSIS — E876 Hypokalemia: Secondary | ICD-10-CM | POA: Diagnosis not present

## 2018-06-21 DIAGNOSIS — Z48815 Encounter for surgical aftercare following surgery on the digestive system: Secondary | ICD-10-CM | POA: Diagnosis not present

## 2018-06-21 DIAGNOSIS — K810 Acute cholecystitis: Secondary | ICD-10-CM | POA: Diagnosis not present

## 2018-06-21 DIAGNOSIS — I110 Hypertensive heart disease with heart failure: Secondary | ICD-10-CM | POA: Diagnosis not present

## 2018-06-24 DIAGNOSIS — K810 Acute cholecystitis: Secondary | ICD-10-CM | POA: Diagnosis not present

## 2018-06-24 DIAGNOSIS — I5031 Acute diastolic (congestive) heart failure: Secondary | ICD-10-CM | POA: Diagnosis not present

## 2018-06-24 DIAGNOSIS — I110 Hypertensive heart disease with heart failure: Secondary | ICD-10-CM | POA: Diagnosis not present

## 2018-06-24 DIAGNOSIS — Z48815 Encounter for surgical aftercare following surgery on the digestive system: Secondary | ICD-10-CM | POA: Diagnosis not present

## 2018-06-24 DIAGNOSIS — E876 Hypokalemia: Secondary | ICD-10-CM | POA: Diagnosis not present

## 2018-06-24 DIAGNOSIS — K3580 Unspecified acute appendicitis: Secondary | ICD-10-CM | POA: Diagnosis not present

## 2018-07-02 DIAGNOSIS — E291 Testicular hypofunction: Secondary | ICD-10-CM | POA: Diagnosis not present

## 2018-07-02 DIAGNOSIS — C61 Malignant neoplasm of prostate: Secondary | ICD-10-CM | POA: Diagnosis not present

## 2018-07-04 DIAGNOSIS — R0681 Apnea, not elsewhere classified: Secondary | ICD-10-CM | POA: Diagnosis not present

## 2018-07-04 DIAGNOSIS — R0683 Snoring: Secondary | ICD-10-CM | POA: Diagnosis not present

## 2018-07-04 DIAGNOSIS — I10 Essential (primary) hypertension: Secondary | ICD-10-CM | POA: Diagnosis not present

## 2018-07-04 DIAGNOSIS — Z192 Hormone resistant malignancy status: Secondary | ICD-10-CM | POA: Diagnosis not present

## 2018-07-04 DIAGNOSIS — R4189 Other symptoms and signs involving cognitive functions and awareness: Secondary | ICD-10-CM | POA: Diagnosis not present

## 2018-07-04 DIAGNOSIS — C61 Malignant neoplasm of prostate: Secondary | ICD-10-CM | POA: Diagnosis not present

## 2018-07-29 ENCOUNTER — Other Ambulatory Visit: Payer: Self-pay | Admitting: Pulmonary Disease

## 2018-07-29 ENCOUNTER — Other Ambulatory Visit (HOSPITAL_COMMUNITY): Payer: Self-pay | Admitting: Pulmonary Disease

## 2018-07-29 DIAGNOSIS — J984 Other disorders of lung: Secondary | ICD-10-CM

## 2018-07-30 ENCOUNTER — Other Ambulatory Visit (HOSPITAL_COMMUNITY): Payer: Self-pay | Admitting: Respiratory Therapy

## 2018-07-30 DIAGNOSIS — R942 Abnormal results of pulmonary function studies: Secondary | ICD-10-CM

## 2018-08-07 ENCOUNTER — Ambulatory Visit (HOSPITAL_COMMUNITY): Payer: Medicare Other

## 2018-08-19 ENCOUNTER — Ambulatory Visit (HOSPITAL_COMMUNITY)
Admission: RE | Admit: 2018-08-19 | Discharge: 2018-08-19 | Disposition: A | Discharge status: Home / Self Care | Payer: Medicare Other | Source: Ambulatory Visit | Attending: Pulmonary Disease | Admitting: Pulmonary Disease

## 2018-08-19 ENCOUNTER — Other Ambulatory Visit: Payer: Self-pay | Admitting: Cardiovascular Disease

## 2018-08-19 DIAGNOSIS — J984 Other disorders of lung: Secondary | ICD-10-CM | POA: Insufficient documentation

## 2018-08-19 DIAGNOSIS — R0602 Shortness of breath: Secondary | ICD-10-CM | POA: Diagnosis not present

## 2018-08-21 ENCOUNTER — Ambulatory Visit (HOSPITAL_COMMUNITY)
Admission: RE | Admit: 2018-08-21 | Discharge: 2018-08-21 | Disposition: A | Discharge status: Home / Self Care | Payer: Medicare Other | Source: Ambulatory Visit | Attending: Pulmonary Disease | Admitting: Pulmonary Disease

## 2018-08-21 DIAGNOSIS — R942 Abnormal results of pulmonary function studies: Secondary | ICD-10-CM | POA: Diagnosis not present

## 2018-08-21 LAB — PULMONARY FUNCTION TEST
DL/VA % PRED: 65 %
DL/VA: 2.97 ml/min/mmHg/L
DLCO UNC: 13.52 ml/min/mmHg
DLCO unc % pred: 43 %
FEF 25-75 POST: 3.79 L/s
FEF 25-75 Pre: 2.96 L/sec
FEF2575-%Change-Post: 27 %
FEF2575-%Pred-Post: 225 %
FEF2575-%Pred-Pre: 175 %
FEV1-%CHANGE-POST: 4 %
FEV1-%PRED-PRE: 91 %
FEV1-%Pred-Post: 95 %
FEV1-POST: 2.47 L
FEV1-PRE: 2.37 L
FEV1FVC-%Change-Post: 1 %
FEV1FVC-%PRED-PRE: 117 %
FEV6-%Change-Post: 2 %
FEV6-%PRED-PRE: 83 %
FEV6-%Pred-Post: 85 %
FEV6-POST: 2.91 L
FEV6-PRE: 2.85 L
FEV6FVC-%Change-Post: 0 %
FEV6FVC-%PRED-POST: 107 %
FEV6FVC-%PRED-PRE: 107 %
FVC-%Change-Post: 2 %
FVC-%PRED-PRE: 77 %
FVC-%Pred-Post: 79 %
FVC-POST: 2.93 L
FVC-Pre: 2.86 L
POST FEV6/FVC RATIO: 100 %
PRE FEV6/FVC RATIO: 100 %
Post FEV1/FVC ratio: 84 %
Pre FEV1/FVC ratio: 83 %
RV % PRED: 68 %
RV: 1.84 L
TLC % pred: 71 %
TLC: 4.94 L

## 2018-08-21 MED ORDER — ALBUTEROL SULFATE (2.5 MG/3ML) 0.083% IN NEBU
2.5000 mg | INHALATION_SOLUTION | Freq: Once | RESPIRATORY_TRACT | Status: AC
  Administered 2018-08-21: 2.5 mg via RESPIRATORY_TRACT

## 2018-08-28 DIAGNOSIS — J069 Acute upper respiratory infection, unspecified: Secondary | ICD-10-CM | POA: Diagnosis not present

## 2018-10-10 DIAGNOSIS — C61 Malignant neoplasm of prostate: Secondary | ICD-10-CM | POA: Diagnosis not present

## 2018-10-10 DIAGNOSIS — R930 Abnormal findings on diagnostic imaging of skull and head, not elsewhere classified: Secondary | ICD-10-CM | POA: Diagnosis not present

## 2019-03-06 DIAGNOSIS — C61 Malignant neoplasm of prostate: Secondary | ICD-10-CM | POA: Diagnosis not present

## 2019-03-13 DIAGNOSIS — S22080A Wedge compression fracture of T11-T12 vertebra, initial encounter for closed fracture: Secondary | ICD-10-CM | POA: Diagnosis not present

## 2019-03-13 DIAGNOSIS — R590 Localized enlarged lymph nodes: Secondary | ICD-10-CM | POA: Diagnosis not present

## 2019-03-13 DIAGNOSIS — C61 Malignant neoplasm of prostate: Secondary | ICD-10-CM | POA: Diagnosis not present

## 2019-03-13 DIAGNOSIS — M8468XA Pathological fracture in other disease, other site, initial encounter for fracture: Secondary | ICD-10-CM | POA: Diagnosis not present

## 2019-03-13 DIAGNOSIS — I10 Essential (primary) hypertension: Secondary | ICD-10-CM | POA: Diagnosis not present

## 2019-03-13 DIAGNOSIS — R0609 Other forms of dyspnea: Secondary | ICD-10-CM | POA: Diagnosis not present

## 2019-04-10 DIAGNOSIS — C61 Malignant neoplasm of prostate: Secondary | ICD-10-CM | POA: Diagnosis not present

## 2019-04-10 DIAGNOSIS — Z136 Encounter for screening for cardiovascular disorders: Secondary | ICD-10-CM | POA: Diagnosis not present

## 2019-04-10 DIAGNOSIS — Z13220 Encounter for screening for lipoid disorders: Secondary | ICD-10-CM | POA: Diagnosis not present

## 2019-04-10 DIAGNOSIS — Z23 Encounter for immunization: Secondary | ICD-10-CM | POA: Diagnosis not present

## 2019-04-10 DIAGNOSIS — R7302 Impaired glucose tolerance (oral): Secondary | ICD-10-CM | POA: Diagnosis not present

## 2019-04-10 DIAGNOSIS — Z9181 History of falling: Secondary | ICD-10-CM | POA: Diagnosis not present

## 2019-04-10 DIAGNOSIS — Z0000 Encounter for general adult medical examination without abnormal findings: Secondary | ICD-10-CM | POA: Diagnosis not present

## 2019-05-20 DIAGNOSIS — R0609 Other forms of dyspnea: Secondary | ICD-10-CM | POA: Diagnosis not present

## 2019-05-20 DIAGNOSIS — M8588 Other specified disorders of bone density and structure, other site: Secondary | ICD-10-CM | POA: Diagnosis not present

## 2019-05-20 DIAGNOSIS — Z192 Hormone resistant malignancy status: Secondary | ICD-10-CM | POA: Diagnosis not present

## 2019-05-20 DIAGNOSIS — C61 Malignant neoplasm of prostate: Secondary | ICD-10-CM | POA: Diagnosis not present

## 2019-05-20 DIAGNOSIS — Z79899 Other long term (current) drug therapy: Secondary | ICD-10-CM | POA: Diagnosis not present

## 2019-05-20 DIAGNOSIS — R413 Other amnesia: Secondary | ICD-10-CM | POA: Diagnosis not present

## 2019-05-20 DIAGNOSIS — I10 Essential (primary) hypertension: Secondary | ICD-10-CM | POA: Diagnosis not present

## 2019-05-20 DIAGNOSIS — Z23 Encounter for immunization: Secondary | ICD-10-CM | POA: Diagnosis not present

## 2019-05-20 DIAGNOSIS — M810 Age-related osteoporosis without current pathological fracture: Secondary | ICD-10-CM | POA: Diagnosis not present

## 2019-05-20 DIAGNOSIS — M4854XD Collapsed vertebra, not elsewhere classified, thoracic region, subsequent encounter for fracture with routine healing: Secondary | ICD-10-CM | POA: Diagnosis not present

## 2019-06-03 DIAGNOSIS — C61 Malignant neoplasm of prostate: Secondary | ICD-10-CM | POA: Diagnosis not present

## 2019-06-30 DIAGNOSIS — L89896 Pressure-induced deep tissue damage of other site: Secondary | ICD-10-CM | POA: Diagnosis not present

## 2019-06-30 DIAGNOSIS — M7989 Other specified soft tissue disorders: Secondary | ICD-10-CM | POA: Diagnosis not present

## 2019-07-03 DIAGNOSIS — R600 Localized edema: Secondary | ICD-10-CM | POA: Diagnosis not present

## 2019-07-03 DIAGNOSIS — M7989 Other specified soft tissue disorders: Secondary | ICD-10-CM | POA: Diagnosis not present

## 2019-09-24 ENCOUNTER — Ambulatory Visit (INDEPENDENT_AMBULATORY_CARE_PROVIDER_SITE_OTHER): Payer: Medicare Other | Admitting: Internal Medicine

## 2019-09-24 ENCOUNTER — Encounter: Payer: Self-pay | Admitting: Internal Medicine

## 2019-09-24 ENCOUNTER — Other Ambulatory Visit: Payer: Self-pay

## 2019-09-24 VITALS — BP 118/86 | HR 105 | Temp 98.6°F | Ht 68.0 in | Wt 257.0 lb

## 2019-09-24 DIAGNOSIS — R0602 Shortness of breath: Secondary | ICD-10-CM | POA: Diagnosis not present

## 2019-09-24 MED ORDER — ALBUTEROL SULFATE HFA 108 (90 BASE) MCG/ACT IN AERS: 2.0000 | INHALATION_SPRAY | Freq: Four times a day (QID) | RESPIRATORY_TRACT | 1 refills | Status: DC | PRN

## 2019-09-24 NOTE — Patient Instructions (Addendum)
The patient should have follow up scheduled with myself in 3 months.    Take albuterol rescue inhaler as needed for shortness of breath, up to 4 times/day.
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
## 2019-12-23 ENCOUNTER — Encounter (HOSPITAL_COMMUNITY)
Admission: EM | Disposition: A | Discharge status: Home / Self Care | Payer: Self-pay | Source: Ambulatory Visit | Attending: Cardiology

## 2019-12-23 ENCOUNTER — Inpatient Hospital Stay (HOSPITAL_COMMUNITY)
Admission: EM | Admit: 2019-12-23 | Discharge: 2019-12-26 | DRG: 247 | Disposition: A | Discharge status: Home / Self Care | Payer: Medicare Other | Source: Ambulatory Visit | Attending: Cardiology | Admitting: Cardiology

## 2019-12-23 ENCOUNTER — Inpatient Hospital Stay
Admission: AD | Admit: 2019-12-23 | Payer: Medicare Other | Source: Other Acute Inpatient Hospital | Admitting: Cardiology

## 2019-12-23 DIAGNOSIS — Z888 Allergy status to other drugs, medicaments and biological substances status: Secondary | ICD-10-CM | POA: Diagnosis not present

## 2019-12-23 DIAGNOSIS — M1990 Unspecified osteoarthritis, unspecified site: Secondary | ICD-10-CM | POA: Diagnosis present

## 2019-12-23 DIAGNOSIS — R0600 Dyspnea, unspecified: Secondary | ICD-10-CM | POA: Diagnosis not present

## 2019-12-23 DIAGNOSIS — Z8546 Personal history of malignant neoplasm of prostate: Secondary | ICD-10-CM | POA: Diagnosis not present

## 2019-12-23 DIAGNOSIS — Z20822 Contact with and (suspected) exposure to covid-19: Secondary | ICD-10-CM | POA: Diagnosis present

## 2019-12-23 DIAGNOSIS — I2584 Coronary atherosclerosis due to calcified coronary lesion: Secondary | ICD-10-CM | POA: Diagnosis present

## 2019-12-23 DIAGNOSIS — Z8051 Family history of malignant neoplasm of kidney: Secondary | ICD-10-CM

## 2019-12-23 DIAGNOSIS — Z8249 Family history of ischemic heart disease and other diseases of the circulatory system: Secondary | ICD-10-CM

## 2019-12-23 DIAGNOSIS — K219 Gastro-esophageal reflux disease without esophagitis: Secondary | ICD-10-CM | POA: Diagnosis present

## 2019-12-23 DIAGNOSIS — I4510 Unspecified right bundle-branch block: Secondary | ICD-10-CM | POA: Diagnosis present

## 2019-12-23 DIAGNOSIS — E785 Hyperlipidemia, unspecified: Secondary | ICD-10-CM | POA: Diagnosis present

## 2019-12-23 DIAGNOSIS — Z79899 Other long term (current) drug therapy: Secondary | ICD-10-CM | POA: Diagnosis not present

## 2019-12-23 DIAGNOSIS — I10 Essential (primary) hypertension: Secondary | ICD-10-CM | POA: Diagnosis present

## 2019-12-23 DIAGNOSIS — I350 Nonrheumatic aortic (valve) stenosis: Secondary | ICD-10-CM | POA: Diagnosis present

## 2019-12-23 DIAGNOSIS — Z801 Family history of malignant neoplasm of trachea, bronchus and lung: Secondary | ICD-10-CM

## 2019-12-23 DIAGNOSIS — R0609 Other forms of dyspnea: Secondary | ICD-10-CM | POA: Diagnosis present

## 2019-12-23 DIAGNOSIS — R000 Tachycardia, unspecified: Secondary | ICD-10-CM | POA: Diagnosis present

## 2019-12-23 DIAGNOSIS — I200 Unstable angina: Secondary | ICD-10-CM | POA: Diagnosis present

## 2019-12-23 DIAGNOSIS — I25110 Atherosclerotic heart disease of native coronary artery with unstable angina pectoris: Secondary | ICD-10-CM | POA: Diagnosis present

## 2019-12-23 DIAGNOSIS — I340 Nonrheumatic mitral (valve) insufficiency: Secondary | ICD-10-CM | POA: Diagnosis not present

## 2019-12-23 DIAGNOSIS — Z87891 Personal history of nicotine dependence: Secondary | ICD-10-CM | POA: Diagnosis not present

## 2019-12-23 DIAGNOSIS — E1142 Type 2 diabetes mellitus with diabetic polyneuropathy: Secondary | ICD-10-CM | POA: Diagnosis present

## 2019-12-23 DIAGNOSIS — I724 Aneurysm of artery of lower extremity: Secondary | ICD-10-CM | POA: Diagnosis not present

## 2019-12-23 HISTORY — PX: LEFT HEART CATH AND CORONARY ANGIOGRAPHY: CATH118249

## 2019-12-23 LAB — BASIC METABOLIC PANEL
Anion gap: 13 (ref 5–15)
BUN: 12 mg/dL (ref 8–23)
CO2: 25 mmol/L (ref 22–32)
Calcium: 8.8 mg/dL — ABNORMAL LOW (ref 8.9–10.3)
Chloride: 102 mmol/L (ref 98–111)
Creatinine, Ser: 1.18 mg/dL (ref 0.61–1.24)
GFR calc Af Amer: >60 mL/min (ref >60)
GFR calc non Af Amer: 56 mL/min — ABNORMAL LOW (ref >60)
Glucose, Bld: 148 mg/dL — ABNORMAL HIGH (ref 70–99)
Potassium: 3.5 mmol/L (ref 3.5–5.1)
Sodium: 140 mmol/L (ref 135–145)

## 2019-12-23 LAB — CBC
HCT: 40.6 % (ref 39.0–52.0)
Hemoglobin: 13.3 g/dL (ref 13.0–17.0)
MCH: 31.1 pg (ref 26.0–34.0)
MCHC: 32.8 g/dL (ref 30.0–36.0)
MCV: 94.9 fL (ref 80.0–100.0)
Platelets: 208 10*3/uL (ref 150–400)
RBC: 4.28 MIL/uL (ref 4.22–5.81)
RDW: 12.5 % (ref 11.5–15.5)
WBC: 11.2 10*3/uL — ABNORMAL HIGH (ref 4.0–10.5)
nRBC: 0 % (ref 0.0–0.2)

## 2019-12-23 LAB — GLUCOSE, CAPILLARY: Glucose-Capillary: 97 mg/dL (ref 70–99)

## 2019-12-23 LAB — MRSA PCR SCREENING: MRSA by PCR: NEGATIVE

## 2019-12-23 SURGERY — LEFT HEART CATH AND CORONARY ANGIOGRAPHY
Anesthesia: LOCAL

## 2019-12-23 MED ORDER — ASPIRIN EC 81 MG PO TBEC
81.0000 mg | DELAYED_RELEASE_TABLET | Freq: Every day | ORAL | Status: DC
  Administered 2019-12-24 – 2019-12-26 (×3): 81 mg via ORAL
  Filled 2019-12-23 (×3): qty 1

## 2019-12-23 MED ORDER — TICAGRELOR 90 MG PO TABS
ORAL_TABLET | ORAL | Status: AC
  Filled 2019-12-23: qty 1

## 2019-12-23 MED ORDER — IOHEXOL 350 MG/ML SOLN
INTRAVENOUS | Status: DC | PRN
  Administered 2019-12-23: 80 mL via INTRA_ARTERIAL

## 2019-12-23 MED ORDER — HEPARIN (PORCINE) IN NACL 1000-0.9 UT/500ML-% IV SOLN
INTRAVENOUS | Status: AC
  Filled 2019-12-23: qty 1000

## 2019-12-23 MED ORDER — FENTANYL CITRATE (PF) 100 MCG/2ML IJ SOLN
INTRAMUSCULAR | Status: AC
  Filled 2019-12-23: qty 2

## 2019-12-23 MED ORDER — METOPROLOL SUCCINATE ER 25 MG PO TB24
25.00 | ORAL_TABLET | ORAL | Status: DC
Start: 2019-12-24 — End: 2019-12-23

## 2019-12-23 MED ORDER — ACETAMINOPHEN 325 MG PO TABS: 650.0000 mg | ORAL_TABLET | ORAL | Status: DC | PRN

## 2019-12-23 MED ORDER — GLIPIZIDE 5 MG PO TABS
5.00 | ORAL_TABLET | ORAL | Status: DC
Start: 2019-12-24 — End: 2019-12-23

## 2019-12-23 MED ORDER — ALBUTEROL SULFATE (2.5 MG/3ML) 0.083% IN NEBU
2.50 | INHALATION_SOLUTION | RESPIRATORY_TRACT | Status: DC
Start: ? — End: 2019-12-23

## 2019-12-23 MED ORDER — LOSARTAN POTASSIUM 50 MG PO TABS
50.00 | ORAL_TABLET | ORAL | Status: DC
Start: 2019-12-24 — End: 2019-12-23

## 2019-12-23 MED ORDER — GUAIFENESIN 100 MG/5ML PO SYRP
200.00 | ORAL_SOLUTION | ORAL | Status: DC
Start: ? — End: 2019-12-23

## 2019-12-23 MED ORDER — AMLODIPINE BESYLATE 5 MG PO TABS
5.0000 mg | ORAL_TABLET | Freq: Every day | ORAL | Status: DC
  Administered 2019-12-24 – 2019-12-26 (×3): 5 mg via ORAL
  Filled 2019-12-23 (×3): qty 1

## 2019-12-23 MED ORDER — TAMSULOSIN HCL 0.4 MG PO CAPS
0.4000 mg | ORAL_CAPSULE | Freq: Every day | ORAL | Status: DC
  Administered 2019-12-23 – 2019-12-26 (×4): 0.4 mg via ORAL
  Filled 2019-12-23 (×4): qty 1

## 2019-12-23 MED ORDER — ASPIRIN 81 MG PO CHEW: 81.0000 mg | CHEWABLE_TABLET | ORAL | Status: DC

## 2019-12-23 MED ORDER — ONDANSETRON HCL 4 MG/2ML IJ SOLN
4.00 | INTRAMUSCULAR | Status: DC
Start: ? — End: 2019-12-23

## 2019-12-23 MED ORDER — TAMSULOSIN HCL 0.4 MG PO CAPS
0.40 | ORAL_CAPSULE | ORAL | Status: DC
Start: 2019-12-24 — End: 2019-12-23

## 2019-12-23 MED ORDER — FUROSEMIDE 40 MG PO TABS
40.00 | ORAL_TABLET | ORAL | Status: DC
Start: 2019-12-24 — End: 2019-12-23

## 2019-12-23 MED ORDER — METOPROLOL TARTRATE 12.5 MG HALF TABLET
12.5000 mg | ORAL_TABLET | Freq: Two times a day (BID) | ORAL | Status: DC
  Administered 2019-12-23 – 2019-12-24 (×3): 12.5 mg via ORAL
  Filled 2019-12-23 (×3): qty 1

## 2019-12-23 MED ORDER — HEPARIN (PORCINE) IN NACL 1000-0.9 UT/500ML-% IV SOLN
INTRAVENOUS | Status: DC | PRN
  Administered 2019-12-23 (×2): 500 mL

## 2019-12-23 MED ORDER — ACETAMINOPHEN 325 MG PO TABS
650.00 | ORAL_TABLET | ORAL | Status: DC
Start: ? — End: 2019-12-23

## 2019-12-23 MED ORDER — SODIUM CHLORIDE 0.9 % IV SOLN: 250.0000 mL | INTRAVENOUS | Status: DC | PRN

## 2019-12-23 MED ORDER — SODIUM CHLORIDE 0.9% FLUSH: 3.0000 mL | INTRAVENOUS | Status: DC | PRN

## 2019-12-23 MED ORDER — SODIUM CHLORIDE 0.9 % IV SOLN: INTRAVENOUS | Status: AC

## 2019-12-23 MED ORDER — NITROGLYCERIN 0.4 MG SL SUBL
0.40 | SUBLINGUAL_TABLET | SUBLINGUAL | Status: DC
Start: ? — End: 2019-12-23

## 2019-12-23 MED ORDER — CALCIUM CARBONATE 1250 (500 CA) MG PO CHEW
CHEWABLE_TABLET | ORAL | Status: DC
Start: ? — End: 2019-12-23

## 2019-12-23 MED ORDER — HEPARIN (PORCINE) 25000 UT/250ML-% IV SOLN
1200.0000 [IU]/h | INTRAVENOUS | Status: DC
  Administered 2019-12-24: 1200 [IU]/h via INTRAVENOUS
  Filled 2019-12-23 (×2): qty 250

## 2019-12-23 MED ORDER — ATORVASTATIN CALCIUM 40 MG PO TABS
40.00 | ORAL_TABLET | ORAL | Status: DC
Start: ? — End: 2019-12-23

## 2019-12-23 MED ORDER — VERAPAMIL HCL 2.5 MG/ML IV SOLN
INTRAVENOUS | Status: AC
  Filled 2019-12-23: qty 2

## 2019-12-23 MED ORDER — NITROGLYCERIN 0.4 MG SL SUBL: 0.4000 mg | SUBLINGUAL_TABLET | SUBLINGUAL | Status: DC | PRN

## 2019-12-23 MED ORDER — HYDRALAZINE HCL 20 MG/ML IJ SOLN: 10.0000 mg | INTRAMUSCULAR | Status: AC | PRN

## 2019-12-23 MED ORDER — ROSUVASTATIN CALCIUM 20 MG PO TABS
40.0000 mg | ORAL_TABLET | Freq: Every day | ORAL | Status: DC
  Administered 2019-12-23 – 2019-12-26 (×4): 40 mg via ORAL
  Filled 2019-12-23: qty 2
  Filled 2019-12-23: qty 8
  Filled 2019-12-23 (×2): qty 2

## 2019-12-23 MED ORDER — GABAPENTIN 300 MG PO CAPS
600.00 | ORAL_CAPSULE | ORAL | Status: DC
Start: 2019-12-23 — End: 2019-12-23

## 2019-12-23 MED ORDER — TICAGRELOR 90 MG PO TABS
180.0000 mg | ORAL_TABLET | Freq: Once | ORAL | Status: AC
  Administered 2019-12-23: 180 mg via ORAL

## 2019-12-23 MED ORDER — ISOSORBIDE MONONITRATE ER 30 MG PO TB24
30.00 | ORAL_TABLET | ORAL | Status: DC
Start: 2019-12-24 — End: 2019-12-23

## 2019-12-23 MED ORDER — MIDAZOLAM HCL 2 MG/2ML IJ SOLN
INTRAMUSCULAR | Status: AC
  Filled 2019-12-23: qty 2

## 2019-12-23 MED ORDER — ONDANSETRON HCL 4 MG/2ML IJ SOLN: 4.0000 mg | Freq: Four times a day (QID) | INTRAMUSCULAR | Status: DC | PRN

## 2019-12-23 MED ORDER — TEMAZEPAM 7.5 MG PO CAPS
15.00 | ORAL_CAPSULE | ORAL | Status: DC
Start: ? — End: 2019-12-23

## 2019-12-23 MED ORDER — SODIUM CHLORIDE 0.9% FLUSH
3.0000 mL | Freq: Two times a day (BID) | INTRAVENOUS | Status: DC
  Administered 2019-12-23 – 2019-12-25 (×3): 3 mL via INTRAVENOUS

## 2019-12-23 MED ORDER — GABAPENTIN 300 MG PO CAPS
900.00 | ORAL_CAPSULE | ORAL | Status: DC
Start: 2019-12-23 — End: 2019-12-23

## 2019-12-23 MED ORDER — LIDOCAINE HCL (PF) 1 % IJ SOLN
INTRAMUSCULAR | Status: AC
  Filled 2019-12-23: qty 30

## 2019-12-23 MED ORDER — SODIUM CHLORIDE 0.9 % WEIGHT BASED INFUSION: 3.0000 mL/kg/h | INTRAVENOUS | Status: DC

## 2019-12-23 MED ORDER — LABETALOL HCL 5 MG/ML IV SOLN: 10.0000 mg | INTRAVENOUS | Status: AC | PRN

## 2019-12-23 MED ORDER — SODIUM CHLORIDE 0.9% FLUSH: 3.0000 mL | Freq: Two times a day (BID) | INTRAVENOUS | Status: DC

## 2019-12-23 MED ORDER — GABAPENTIN 300 MG PO CAPS
600.0000 mg | ORAL_CAPSULE | Freq: Two times a day (BID) | ORAL | Status: DC
  Administered 2019-12-23 – 2019-12-26 (×6): 600 mg via ORAL
  Filled 2019-12-23 (×6): qty 2

## 2019-12-23 MED ORDER — MIDAZOLAM HCL 2 MG/2ML IJ SOLN
INTRAMUSCULAR | Status: DC | PRN
  Administered 2019-12-23: 1 mg via INTRAVENOUS

## 2019-12-23 MED ORDER — LIDOCAINE HCL (PF) 1 % IJ SOLN
INTRAMUSCULAR | Status: DC | PRN
  Administered 2019-12-23: 10 mL
  Administered 2019-12-23: 2 mL

## 2019-12-23 MED ORDER — ACETAMINOPHEN 325 MG PO TABS: 650.0000 mg | ORAL_TABLET | Freq: Four times a day (QID) | ORAL | Status: DC | PRN

## 2019-12-23 MED ORDER — ASPIRIN 81 MG PO CHEW
81.00 | CHEWABLE_TABLET | ORAL | Status: DC
Start: 2019-12-24 — End: 2019-12-23

## 2019-12-23 MED ORDER — TICAGRELOR 90 MG PO TABS
90.0000 mg | ORAL_TABLET | Freq: Two times a day (BID) | ORAL | Status: DC
  Administered 2019-12-24 – 2019-12-26 (×5): 90 mg via ORAL
  Filled 2019-12-23 (×5): qty 1

## 2019-12-23 MED ORDER — SODIUM CHLORIDE 0.9 % WEIGHT BASED INFUSION: 1.0000 mL/kg/h | INTRAVENOUS | Status: DC

## 2019-12-23 MED ORDER — HEPARIN SODIUM (PORCINE) 5000 UNIT/ML IJ SOLN: 5000.0000 [IU] | Freq: Three times a day (TID) | INTRAMUSCULAR | Status: DC

## 2019-12-23 MED ORDER — FENTANYL CITRATE (PF) 100 MCG/2ML IJ SOLN
INTRAMUSCULAR | Status: DC | PRN
  Administered 2019-12-23: 25 ug via INTRAVENOUS

## 2019-12-23 SURGICAL SUPPLY — 16 items
CATH 5FR JL3.5 JR4 ANG PIG MP (CATHETERS) ×1 IMPLANT
CATH INFINITI 5FR AL1 (CATHETERS) ×1 IMPLANT
CATH INFINITI 5FR JL4 (CATHETERS) ×1 IMPLANT
CATH INFINITI 5FR JL5 (CATHETERS) ×1 IMPLANT
DEVICE RAD COMP TR BAND LRG (VASCULAR PRODUCTS) ×1 IMPLANT
GLIDESHEATH SLEND A-KIT 6F 22G (SHEATH) ×1 IMPLANT
GUIDEWIRE INQWIRE 1.5J.035X260 (WIRE) IMPLANT
INQWIRE 1.5J .035X260CM (WIRE) ×2
KIT HEART LEFT (KITS) ×2 IMPLANT
KIT MICROPUNCTURE NIT STIFF (SHEATH) ×1 IMPLANT
PACK CARDIAC CATHETERIZATION (CUSTOM PROCEDURE TRAY) ×2 IMPLANT
SHEATH PINNACLE 5F 10CM (SHEATH) ×1 IMPLANT
SHEATH PROBE COVER 6X72 (BAG) ×1 IMPLANT
TRANSDUCER W/STOPCOCK (MISCELLANEOUS) ×2 IMPLANT
TUBING CIL FLEX 10 FLL-RA (TUBING) ×2 IMPLANT
WIRE EMERALD 3MM-J .035X150CM (WIRE) ×1 IMPLANT

## 2019-12-23 NOTE — Interval H&P Note (Signed)
History and Physical Interval Note:  12/23/2019 4:52 PM  Travis Moyer  has presented today for surgery, with the diagnosis of unstable angina.  The various methods of treatment have been discussed with the patient and family. After consideration of risks, benefits and other options for treatment, the patient has consented to  Procedure(s): RIGHT/LEFT HEART CATH AND CORONARY ANGIOGRAPHY (N/A) as a surgical intervention.  The patient's history has been reviewed, patient examined, no change in status, stable for surgery.  I have reviewed the patient's chart and labs.  Questions were answered to the patient's satisfaction.    2016 Appropriate Use Criteria for Coronary Revascularization in Patients With Acute Coronary Syndrome NSTEMI/UA Intermediate Risk (TIMI Score 3-4) NSTEMI/Unstable angina, stabilized patient at Intermediate Risk (TIMI Score 3-4) Link Here: http://dodson-rose.net/ Indication:  Revascularization by PCI or CABG of 1 or more arteries in a patient with NSTEMI or unstable angina with Stabilization after presentation Intermediate risk for clinical events  A (7) Indication: 16; Score 7 [place]
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
## 2019-12-23 NOTE — Progress Notes (Signed)
Received pt from [facility] alert and oriented X4, skin warm and dry, resp even and unlabored and denies any discomfort at this time.  Consent signed,

## 2019-12-24 ENCOUNTER — Ambulatory Visit (HOSPITAL_COMMUNITY): Admit: 2019-12-24 | Payer: Medicare Other | Admitting: Cardiology

## 2019-12-24 ENCOUNTER — Inpatient Hospital Stay (HOSPITAL_COMMUNITY): Payer: Medicare Other

## 2019-12-24 ENCOUNTER — Encounter (HOSPITAL_COMMUNITY)
Admission: EM | Disposition: A | Discharge status: Home / Self Care | Payer: Self-pay | Source: Ambulatory Visit | Attending: Cardiology

## 2019-12-24 DIAGNOSIS — I724 Aneurysm of artery of lower extremity: Secondary | ICD-10-CM

## 2019-12-24 DIAGNOSIS — I340 Nonrheumatic mitral (valve) insufficiency: Secondary | ICD-10-CM

## 2019-12-24 HISTORY — PX: INTRAVASCULAR ULTRASOUND/IVUS: CATH118244

## 2019-12-24 HISTORY — PX: CORONARY ATHERECTOMY: CATH118238

## 2019-12-24 LAB — PROTIME-INR
INR: 1.1 (ref 0.8–1.2)
Prothrombin Time: 13.8 seconds (ref 11.4–15.2)

## 2019-12-24 LAB — BASIC METABOLIC PANEL
Anion gap: 12 (ref 5–15)
BUN: 11 mg/dL (ref 8–23)
CO2: 25 mmol/L (ref 22–32)
Calcium: 8.5 mg/dL — ABNORMAL LOW (ref 8.9–10.3)
Chloride: 103 mmol/L (ref 98–111)
Creatinine, Ser: 1.08 mg/dL (ref 0.61–1.24)
GFR calc Af Amer: >60 mL/min (ref >60)
GFR calc non Af Amer: >60 mL/min (ref >60)
Glucose, Bld: 110 mg/dL — ABNORMAL HIGH (ref 70–99)
Potassium: 3.1 mmol/L — ABNORMAL LOW (ref 3.5–5.1)
Sodium: 140 mmol/L (ref 135–145)

## 2019-12-24 LAB — CBC WITH DIFFERENTIAL/PLATELET
Abs Immature Granulocytes: 0.04 10*3/uL (ref 0.00–0.07)
Basophils Absolute: 0 10*3/uL (ref 0.0–0.1)
Basophils Relative: 0 %
Eosinophils Absolute: 0.1 10*3/uL (ref 0.0–0.5)
Eosinophils Relative: 1 %
HCT: 36.3 % — ABNORMAL LOW (ref 39.0–52.0)
Hemoglobin: 12.1 g/dL — ABNORMAL LOW (ref 13.0–17.0)
Immature Granulocytes: 0 %
Lymphocytes Relative: 31 %
Lymphs Abs: 3.8 10*3/uL (ref 0.7–4.0)
MCH: 31.5 pg (ref 26.0–34.0)
MCHC: 33.3 g/dL (ref 30.0–36.0)
MCV: 94.5 fL (ref 80.0–100.0)
Monocytes Absolute: 1 10*3/uL (ref 0.1–1.0)
Monocytes Relative: 8 %
Neutro Abs: 7.3 10*3/uL (ref 1.7–7.7)
Neutrophils Relative %: 60 %
Platelets: 196 10*3/uL (ref 150–400)
RBC: 3.84 MIL/uL — ABNORMAL LOW (ref 4.22–5.81)
RDW: 12.4 % (ref 11.5–15.5)
WBC: 12.4 10*3/uL — ABNORMAL HIGH (ref 4.0–10.5)
nRBC: 0 % (ref 0.0–0.2)

## 2019-12-24 LAB — GLUCOSE, CAPILLARY
Glucose-Capillary: 124 mg/dL — ABNORMAL HIGH (ref 70–99)
Glucose-Capillary: 115 mg/dL — ABNORMAL HIGH (ref 70–99)
Glucose-Capillary: 148 mg/dL — ABNORMAL HIGH (ref 70–99)
Glucose-Capillary: 125 mg/dL — ABNORMAL HIGH (ref 70–99)

## 2019-12-24 LAB — HEMOGLOBIN A1C
Hgb A1c MFr Bld: 6.6 % — ABNORMAL HIGH (ref 4.8–5.6)
Mean Plasma Glucose: 142.72 mg/dL

## 2019-12-24 LAB — BRAIN NATRIURETIC PEPTIDE: B Natriuretic Peptide: 37.8 pg/mL (ref 0.0–100.0)

## 2019-12-24 LAB — SARS CORONAVIRUS 2 (TAT 6-24 HRS): SARS Coronavirus 2: NEGATIVE

## 2019-12-24 LAB — POCT ACTIVATED CLOTTING TIME
Activated Clotting Time: 312 seconds
Activated Clotting Time: 313 seconds
Activated Clotting Time: 318 seconds

## 2019-12-24 LAB — ECHOCARDIOGRAM COMPLETE
Height: 69 in
Weight: 4007.08 oz

## 2019-12-24 LAB — LIPID PANEL
Cholesterol: 77 mg/dL (ref 0–200)
HDL: 30 mg/dL — ABNORMAL LOW (ref >40)
LDL Cholesterol: 31 mg/dL (ref 0–99)
Total CHOL/HDL Ratio: 2.6 RATIO
Triglycerides: 80 mg/dL (ref <150)
VLDL: 16 mg/dL (ref 0–40)

## 2019-12-24 SURGERY — CORONARY ATHERECTOMY
Anesthesia: LOCAL

## 2019-12-24 MED ORDER — SODIUM CHLORIDE 0.9% FLUSH
3.0000 mL | Freq: Two times a day (BID) | INTRAVENOUS | Status: DC
  Administered 2019-12-24 – 2019-12-25 (×3): 3 mL via INTRAVENOUS

## 2019-12-24 MED ORDER — LIDOCAINE HCL (PF) 1 % IJ SOLN
INTRAMUSCULAR | Status: DC | PRN
  Administered 2019-12-24: 30 mL via INTRADERMAL

## 2019-12-24 MED ORDER — CHLORHEXIDINE GLUCONATE 0.12 % MT SOLN
OROMUCOSAL | Status: AC
  Administered 2019-12-24: 15 mL
  Filled 2019-12-24: qty 15

## 2019-12-24 MED ORDER — HEPARIN SODIUM (PORCINE) 1000 UNIT/ML IJ SOLN
INTRAMUSCULAR | Status: DC | PRN
  Administered 2019-12-24: 10000 [IU] via INTRAVENOUS

## 2019-12-24 MED ORDER — SODIUM CHLORIDE 0.9 % IV SOLN: 250.0000 mL | INTRAVENOUS | Status: DC | PRN

## 2019-12-24 MED ORDER — SODIUM CHLORIDE 0.9% FLUSH
3.0000 mL | Freq: Two times a day (BID) | INTRAVENOUS | Status: DC
  Administered 2019-12-24 – 2019-12-25 (×2): 3 mL via INTRAVENOUS

## 2019-12-24 MED ORDER — MIDAZOLAM HCL 2 MG/2ML IJ SOLN
INTRAMUSCULAR | Status: DC | PRN
  Administered 2019-12-24 (×2): 1 mg via INTRAVENOUS

## 2019-12-24 MED ORDER — POTASSIUM CHLORIDE CRYS ER 20 MEQ PO TBCR
40.0000 meq | EXTENDED_RELEASE_TABLET | ORAL | Status: AC
  Administered 2019-12-24 (×2): 40 meq via ORAL
  Filled 2019-12-24 (×2): qty 2

## 2019-12-24 MED ORDER — NITROGLYCERIN 1 MG/10 ML FOR IR/CATH LAB
INTRA_ARTERIAL | Status: AC
  Filled 2019-12-24: qty 10

## 2019-12-24 MED ORDER — IOHEXOL 350 MG/ML SOLN
INTRAVENOUS | Status: AC
  Filled 2019-12-24: qty 1

## 2019-12-24 MED ORDER — IOHEXOL 350 MG/ML SOLN
INTRAVENOUS | Status: DC | PRN
  Administered 2019-12-24: 165 mL via INTRA_ARTERIAL

## 2019-12-24 MED ORDER — HEPARIN SODIUM (PORCINE) 1000 UNIT/ML IJ SOLN
INTRAMUSCULAR | Status: AC
  Filled 2019-12-24: qty 1

## 2019-12-24 MED ORDER — MIDAZOLAM HCL 2 MG/2ML IJ SOLN
INTRAMUSCULAR | Status: AC
  Filled 2019-12-24: qty 2

## 2019-12-24 MED ORDER — CHLORHEXIDINE GLUCONATE 0.12 % MT SOLN
15.0000 mL | Freq: Two times a day (BID) | OROMUCOSAL | Status: DC
  Administered 2019-12-24 – 2019-12-25 (×2): 15 mL via OROMUCOSAL
  Filled 2019-12-24 (×4): qty 15

## 2019-12-24 MED ORDER — HYDRALAZINE HCL 20 MG/ML IJ SOLN: 10.0000 mg | INTRAMUSCULAR | Status: AC | PRN

## 2019-12-24 MED ORDER — LABETALOL HCL 5 MG/ML IV SOLN: 10.0000 mg | INTRAVENOUS | Status: AC | PRN

## 2019-12-24 MED ORDER — LIDOCAINE HCL (PF) 1 % IJ SOLN
INTRAMUSCULAR | Status: AC
  Filled 2019-12-24: qty 30

## 2019-12-24 MED ORDER — FENTANYL CITRATE (PF) 100 MCG/2ML IJ SOLN
INTRAMUSCULAR | Status: DC | PRN
  Administered 2019-12-24 (×2): 50 ug via INTRAVENOUS

## 2019-12-24 MED ORDER — NITROGLYCERIN 1 MG/10 ML FOR IR/CATH LAB
INTRA_ARTERIAL | Status: DC | PRN
  Administered 2019-12-24 (×3): 200 ug via INTRACORONARY

## 2019-12-24 MED ORDER — SODIUM CHLORIDE 0.9 % WEIGHT BASED INFUSION
3.0000 mL/kg/h | INTRAVENOUS | Status: AC
  Administered 2019-12-24: 3 mL/kg/h via INTRAVENOUS

## 2019-12-24 MED ORDER — SODIUM CHLORIDE 0.9% FLUSH: 3.0000 mL | INTRAVENOUS | Status: DC | PRN

## 2019-12-24 MED ORDER — HEPARIN (PORCINE) IN NACL 1000-0.9 UT/500ML-% IV SOLN
INTRAVENOUS | Status: DC | PRN
  Administered 2019-12-24 (×2): 500 mL

## 2019-12-24 MED ORDER — FENTANYL CITRATE (PF) 100 MCG/2ML IJ SOLN
INTRAMUSCULAR | Status: AC
  Filled 2019-12-24: qty 2

## 2019-12-24 MED ORDER — ORAL CARE MOUTH RINSE
15.0000 mL | Freq: Two times a day (BID) | OROMUCOSAL | Status: DC
  Administered 2019-12-24: 15 mL via OROMUCOSAL

## 2019-12-24 MED ORDER — HEPARIN (PORCINE) IN NACL 1000-0.9 UT/500ML-% IV SOLN
INTRAVENOUS | Status: AC
  Filled 2019-12-24: qty 1000

## 2019-12-24 MED ORDER — SODIUM CHLORIDE 0.9 % WEIGHT BASED INFUSION: 1.0000 mL/kg/h | INTRAVENOUS | Status: DC

## 2019-12-24 MED ORDER — ONDANSETRON HCL 4 MG/2ML IJ SOLN: 4.0000 mg | Freq: Four times a day (QID) | INTRAMUSCULAR | Status: DC | PRN

## 2019-12-24 MED ORDER — ACETAMINOPHEN 325 MG PO TABS: 650.0000 mg | ORAL_TABLET | ORAL | Status: DC | PRN

## 2019-12-24 MED ORDER — SODIUM CHLORIDE 0.9 % IV SOLN: INTRAVENOUS | Status: AC

## 2019-12-24 MED ORDER — ASPIRIN 81 MG PO CHEW
81.0000 mg | CHEWABLE_TABLET | ORAL | Status: AC
  Administered 2019-12-24: 81 mg via ORAL
  Filled 2019-12-24: qty 1

## 2019-12-24 MED FILL — Verapamil HCl IV Soln 2.5 MG/ML: INTRAVENOUS | Qty: 2 | Status: AC

## 2019-12-24 SURGICAL SUPPLY — 32 items
BALLN SAPPHIRE 3.0X15 (BALLOONS) ×2
BALLN SAPPHIRE ~~LOC~~ 2.5X12 (BALLOONS) ×2 IMPLANT
BALLN SAPPHIRE ~~LOC~~ 3.0X15 (BALLOONS) ×2 IMPLANT
BALLN SAPPHIRE ~~LOC~~ 3.75X12 (BALLOONS) ×2 IMPLANT
BALLN SAPPHIRE ~~LOC~~ 4.0X8 (BALLOONS) ×2 IMPLANT
BALLOON SAPPHIRE 3.0X15 (BALLOONS) ×1 IMPLANT
CATH LAUNCHER 6FR AL1 (CATHETERS) ×1 IMPLANT
CATH OPTICROSS 40MHZ (CATHETERS) ×2 IMPLANT
CATH TELEPORT (CATHETERS) ×2 IMPLANT
CATH VISTA GUIDE 6FR AL2 (CATHETERS) ×2 IMPLANT
CATHETER LAUNCHER 6FR AL1 (CATHETERS) ×2
CLOSURE MYNX CONTROL 6F/7F (Vascular Products) ×2 IMPLANT
CROWN DIAMONDBACK CLASSIC 1.25 (BURR) ×2 IMPLANT
ELECT DEFIB PAD ADLT CADENCE (PAD) ×2 IMPLANT
HOVERMATT SINGLE USE (MISCELLANEOUS) ×2 IMPLANT
KIT ENCORE 26 ADVANTAGE (KITS) ×2 IMPLANT
KIT HEART LEFT (KITS) ×2 IMPLANT
KIT HEMO VALVE WATCHDOG (MISCELLANEOUS) ×2 IMPLANT
KIT MICROPUNCTURE NIT STIFF (SHEATH) ×2 IMPLANT
LUBRICANT VIPERSLIDE CORONARY (MISCELLANEOUS) ×2 IMPLANT
PACK CARDIAC CATHETERIZATION (CUSTOM PROCEDURE TRAY) ×2 IMPLANT
SHEATH PINNACLE 6F 10CM (SHEATH) ×2 IMPLANT
SHEATH PROBE COVER 6X72 (BAG) ×2 IMPLANT
SLED PULL BACK IVUS (MISCELLANEOUS) ×2 IMPLANT
STENT RESOLUTE ONYX 3.0X34 (Permanent Stent) ×2 IMPLANT
STENT RESOLUTE ONYX 3.5X22 (Permanent Stent) ×2 IMPLANT
TRANSDUCER W/STOPCOCK (MISCELLANEOUS) ×2 IMPLANT
TUBING CIL FLEX 10 FLL-RA (TUBING) ×2 IMPLANT
WIRE COUGAR XT STRL 190CM (WIRE) ×2 IMPLANT
WIRE COUGAR XT STRL 300CM (WIRE) ×2 IMPLANT
WIRE EMERALD 3MM-J .035X150CM (WIRE) ×2 IMPLANT
WIRE VIPERWIRE COR FLEX .012 (WIRE) ×2 IMPLANT

## 2019-12-24 NOTE — Progress Notes (Signed)
Rt. Groin duplex done and small hematoma, no pseudoaneurysm. Explained patient regarding this. Patient stated Rt. Groin pain was better than before. Patient refused to take tylenol for pain. Patient didn't want to ambulate at this time, but explained patient that he need to ambulate before go to bed. Patient agreed with this. HS [employer]

## 2019-12-24 NOTE — Progress Notes (Signed)
Coronary atherectomy, prox-mid LAD PCI 6 Fr Mynx Stop heparin Full report to follow  Nigel Mormon, MD [facility] Pager: [phone] Office: [phone]

## 2019-12-24 NOTE — Interval H&P Note (Signed)
History and Physical Interval Note:  12/24/2019 8:02 AM  Travis Moyer  has presented today for surgery, with the diagnosis of cad.  The various methods of treatment have been discussed with the patient and family. After consideration of risks, benefits and other options for treatment, the patient has consented to  Procedure(s): CORONARY ATHERECTOMY (N/A) as a surgical intervention.  The patient's history has been reviewed, patient examined, no change in status, stable for surgery.  I have reviewed the patient's chart and labs.  Questions were answered to the patient's satisfaction.    2016 Appropriate Use Criteria for Coronary Revascularization in Patients With Acute Coronary Syndrome NSTEMI/UA High Risk (TIMI Score 5-7) NSTEMI/Unstable angina, stabilized patient at high risk Link Here: sistemancia.com Indication:  Revascularization by PCI or CABG of 1 or more arteries in a patient with NSTEMI or unstable angina with Stabilization after presentation High risk for clinical events  A (7) Indication: 16; Score 7   [place]

## 2019-12-24 NOTE — Plan of Care (Signed)
  Problem: Education: Goal: Understanding of CV disease, CV risk reduction, and recovery process will improve Outcome: Progressing   Problem: Cardiovascular: Goal: Ability to achieve and maintain adequate cardiovascular perfusion will improve Outcome: Progressing Goal: Vascular access site(s) Level 0-1 will be maintained Outcome: Progressing

## 2019-12-24 NOTE — Progress Notes (Signed)
Right groin arterial duplex       has been completed. Preliminary results can be found under CV proc through chart review. June Leap, BS, RDMS, RVT

## 2019-12-24 NOTE — Progress Notes (Signed)
Patient c/o pain on Rt. Groin, blood showed on the dressing, but area is soft, no hematoma noticed. Very tender on Rt. Groin, called cath lab for this matter for assessing this. HS [employer]

## 2019-12-24 NOTE — Progress Notes (Signed)
  Echocardiogram 2D Echocardiogram has been performed.  Tiffany G Dance 12/24/2019, 1:56 PM

## 2019-12-25 ENCOUNTER — Other Ambulatory Visit: Payer: Self-pay

## 2019-12-25 ENCOUNTER — Inpatient Hospital Stay (HOSPITAL_COMMUNITY): Payer: Medicare Other

## 2019-12-25 DIAGNOSIS — I200 Unstable angina: Secondary | ICD-10-CM

## 2019-12-25 DIAGNOSIS — R0600 Dyspnea, unspecified: Secondary | ICD-10-CM

## 2019-12-25 LAB — GLUCOSE, CAPILLARY: Glucose-Capillary: 130 mg/dL — ABNORMAL HIGH (ref 70–99)

## 2019-12-25 LAB — CBC WITH DIFFERENTIAL/PLATELET
Abs Immature Granulocytes: 0.03 10*3/uL (ref 0.00–0.07)
Basophils Absolute: 0 10*3/uL (ref 0.0–0.1)
Basophils Relative: 0 %
Eosinophils Absolute: 0.1 10*3/uL (ref 0.0–0.5)
Eosinophils Relative: 1 %
HCT: 38.6 % — ABNORMAL LOW (ref 39.0–52.0)
Hemoglobin: 12.6 g/dL — ABNORMAL LOW (ref 13.0–17.0)
Immature Granulocytes: 0 %
Lymphocytes Relative: 29 %
Lymphs Abs: 3.1 10*3/uL (ref 0.7–4.0)
MCH: 31.2 pg (ref 26.0–34.0)
MCHC: 32.6 g/dL (ref 30.0–36.0)
MCV: 95.5 fL (ref 80.0–100.0)
Monocytes Absolute: 0.9 10*3/uL (ref 0.1–1.0)
Monocytes Relative: 9 %
Neutro Abs: 6.3 10*3/uL (ref 1.7–7.7)
Neutrophils Relative %: 61 %
Platelets: 201 10*3/uL (ref 150–400)
RBC: 4.04 MIL/uL — ABNORMAL LOW (ref 4.22–5.81)
RDW: 12.4 % (ref 11.5–15.5)
WBC: 10.5 10*3/uL (ref 4.0–10.5)
nRBC: 0 % (ref 0.0–0.2)

## 2019-12-25 LAB — BASIC METABOLIC PANEL
Anion gap: 9 (ref 5–15)
BUN: 10 mg/dL (ref 8–23)
CO2: 22 mmol/L (ref 22–32)
Calcium: 8.5 mg/dL — ABNORMAL LOW (ref 8.9–10.3)
Chloride: 110 mmol/L (ref 98–111)
Creatinine, Ser: 1.05 mg/dL (ref 0.61–1.24)
GFR calc Af Amer: >60 mL/min (ref >60)
GFR calc non Af Amer: >60 mL/min (ref >60)
Glucose, Bld: 137 mg/dL — ABNORMAL HIGH (ref 70–99)
Potassium: 3.9 mmol/L (ref 3.5–5.1)
Sodium: 141 mmol/L (ref 135–145)

## 2019-12-25 MED ORDER — FUROSEMIDE 40 MG PO TABS: 40.0000 mg | ORAL_TABLET | Freq: Two times a day (BID) | ORAL | 0 refills | Status: AC

## 2019-12-25 MED ORDER — TICAGRELOR 90 MG PO TABS: 90.0000 mg | ORAL_TABLET | Freq: Two times a day (BID) | ORAL | 1 refills | Status: AC

## 2019-12-25 MED ORDER — FUROSEMIDE 40 MG PO TABS
40.0000 mg | ORAL_TABLET | Freq: Two times a day (BID) | ORAL | Status: DC
  Administered 2019-12-25 – 2019-12-26 (×2): 40 mg via ORAL
  Filled 2019-12-25 (×2): qty 1

## 2019-12-25 MED ORDER — FUROSEMIDE 10 MG/ML IJ SOLN
40.0000 mg | Freq: Once | INTRAMUSCULAR | Status: AC
  Administered 2019-12-25: 40 mg via INTRAVENOUS
  Filled 2019-12-25: qty 4

## 2019-12-25 MED ORDER — ASPIRIN 81 MG PO TBEC: 81.0000 mg | DELAYED_RELEASE_TABLET | Freq: Every day | ORAL | 1 refills | Status: AC

## 2019-12-25 MED ORDER — FUROSEMIDE 40 MG PO TABS
40.0000 mg | ORAL_TABLET | Freq: Every day | ORAL | Status: DC
  Administered 2019-12-25: 40 mg via ORAL
  Filled 2019-12-25: qty 1

## 2019-12-25 MED ORDER — NITROGLYCERIN 0.4 MG SL SUBL: 0.4000 mg | SUBLINGUAL_TABLET | SUBLINGUAL | 1 refills | Status: AC | PRN

## 2019-12-25 MED ORDER — LOSARTAN POTASSIUM 100 MG PO TABS: 50.0000 mg | ORAL_TABLET | Freq: Every day | ORAL | 0 refills | Status: DC

## 2019-12-25 MED ORDER — LOSARTAN POTASSIUM 50 MG PO TABS
50.0000 mg | ORAL_TABLET | Freq: Every day | ORAL | Status: DC
  Administered 2019-12-25 – 2019-12-26 (×2): 50 mg via ORAL
  Filled 2019-12-25 (×2): qty 1

## 2019-12-25 MED ORDER — FUROSEMIDE 40 MG PO TABS: 40.0000 mg | ORAL_TABLET | Freq: Two times a day (BID) | ORAL | 0 refills | Status: DC

## 2019-12-25 MED ORDER — LOSARTAN POTASSIUM 50 MG PO TABS: 50.0000 mg | ORAL_TABLET | Freq: Every day | ORAL | 0 refills | Status: AC

## 2019-12-25 MED ORDER — ISOSORBIDE MONONITRATE ER 30 MG PO TB24
30.0000 mg | ORAL_TABLET | Freq: Every day | ORAL | Status: DC
  Administered 2019-12-25 – 2019-12-26 (×2): 30 mg via ORAL
  Filled 2019-12-25 (×2): qty 1

## 2019-12-25 MED ORDER — POTASSIUM CHLORIDE CRYS ER 10 MEQ PO TBCR
20.0000 meq | EXTENDED_RELEASE_TABLET | Freq: Every day | ORAL | Status: DC
  Administered 2019-12-25 – 2019-12-26 (×2): 20 meq via ORAL
  Filled 2019-12-25 (×3): qty 2

## 2019-12-25 MED ORDER — METOPROLOL SUCCINATE ER 25 MG PO TB24
25.0000 mg | ORAL_TABLET | Freq: Every day | ORAL | Status: DC
  Administered 2019-12-25 – 2019-12-26 (×2): 25 mg via ORAL
  Filled 2019-12-25 (×2): qty 1

## 2019-12-25 MED FILL — ASPIRIN LOW DOSE 81 MG TBEC: 81 | 30 days supply | Qty: 30 | Fill #0

## 2019-12-25 MED FILL — FUROSEMIDE 40 MG TABLET: 40 | 30 days supply | Qty: 60 | Fill #0

## 2019-12-25 MED FILL — NITROGLYCERIN 0.4 MG TAB SL: 0.4 | 7 days supply | Qty: 25 | Fill #0

## 2019-12-25 MED FILL — BRILINTA 90 MG TABLET: 90 | 30 days supply | Qty: 60 | Fill #0

## 2019-12-25 MED FILL — LOSARTAN POTASSIUM 50 MG TA: 50 | 30 days supply | Qty: 30 | Fill #0

## 2019-12-25 NOTE — TOC Benefit Eligibility Note (Signed)
Transition of Care [facility]) Benefit Eligibility Note    Patient Details  Name: Travis Moyer MRN: [id number] Date of Birth: [birth year]   Medication/Dose: BRILINTA  90  MG BID  Covered?: Yes  Tier: (TIER- 4 DRUG)  Prescription Coverage [facility]erred Pharmacy[facility]  Spoke with Person/Company/Phone Number:: JONA  @ [employer] [facility] [employer] [id number] # [phone]  Co-Pay: $ 234.34     Deductible: Unmet       Memory Argue Phone Number: 12/25/2019, 11:46 AM

## 2019-12-25 NOTE — Progress Notes (Signed)
08:20 - [id number]  Pt in chair, just back form walk with PT, agrees to education. Dicussed PCI procedure and provided patient with stent card. Stressed to pt to copy/carry stent card. Dicussed wound care of both cath sites, s/s to monitor for, activity restrictions and precautions. Reviewed heart healthy, low Na and diabetic diet and provided handouts on each. Pt does not have his own glucometer, his bother brings his a few times per week to check. Encouraged patient to speak to his physician about a glucometer. Reviewed activity and encouraged walking inside the house. Encouraged use of his walker or cane for safety. Stressed to patient to take all medications, especially Brilinta/asa. Pt will be referred to cardiac rehab in [place], [place]. Pt verbalized understanding of education.    Lesly Rubenstein MS, ACSM-EP-C, CCRP

## 2019-12-25 NOTE — Evaluation (Signed)
Physical Therapy Evaluation Patient Details Name: Travis Moyer MRN: [id number] DOB: 11-18-1931 Today's Date: 12/25/2019   History of Present Illness  Pt is a 86 y.o. M with significant PMH of HTN, type 2 DM, moderate aortic stenosis, h/o prostate CA, who presents with dyspnea with minimal activity and retrosternal chest pain. Now s/p left coronary atherectomy 12/24/2019.   Clinical Impression  Prior to admission, pt lives with his wife, is ambulatory with a cane, and independent with ADL's. Pt denies pain other than toothache. Presents with decreased functional mobility secondary to decreased cardiopulmonary endurance and weakness. Ambulating 130 feet with a walker without physical assist. Brief desaturation to 88% on RA, but rebounded > 90% on RA with cues for standing rest break and pursed lip breathing. HR 94-106 bpm, BP 156/86 post exertion. Education provided on endurance strategies, appropriate DME (Rollator for longer distances), and energy conservation. Pt verbalized understanding.     Follow Up Recommendations Other (follow up with Cardiac Rehab Program)    Equipment Recommendations  None recommended by PT    Recommendations for Other Services       Precautions / Restrictions Precautions Precautions: Fall Restrictions Weight Bearing Restrictions: No      Mobility  Bed Mobility Overal bed mobility: Modified Independent                Transfers Overall transfer level: Needs assistance Equipment used: Rolling walker (2 wheeled) Transfers: Sit to/from Stand Sit to Stand: Supervision            Ambulation/Gait Ambulation/Gait assistance: Min guard Gait Distance (Feet): 130 Feet Assistive device: Rolling walker (2 wheeled) Gait Pattern/deviations: Step-through pattern;Decreased stride length Gait velocity: decreased   General Gait Details: Slow but steady pace, no gross imbalance. Min guard assist for safety.  Stairs            Wheelchair  Mobility    Modified Rankin (Stroke Patients Only)       Balance Overall balance assessment: Mild deficits observed, not formally tested                                           Pertinent Vitals/Pain Pain Assessment: Faces Faces Pain Scale: Hurts a little bit Pain Location: toothache Pain Descriptors / Indicators: Aching Pain Intervention(s): Monitored during session    Home Living Family/patient expects to be discharged to:: Private residence Living Arrangements: Spouse/significant other Available Help at Discharge: Family;Available 24 hours/day Type of Home: House Home Access: Stairs to enter Entrance Stairs-Rails: Psychiatric nurse of Steps: 6 Home Layout: One level Home Equipment: Clinical cytogeneticist - 4 wheels;Cane - single point      Prior Function Level of Independence: Independent with assistive device(s)         Comments: uses cane, household ambulator      Hand Dominance        Extremity/Trunk Assessment   Upper Extremity Assessment Upper Extremity Assessment: Overall WFL for tasks assessed    Lower Extremity Assessment Lower Extremity Assessment: Generalized weakness       Communication   Communication: No difficulties  Cognition Arousal/Alertness: Awake/alert Behavior During Therapy: WFL for tasks assessed/performed Overall Cognitive Status: Within Functional Limits for tasks assessed  General Comments      Exercises     Assessment/Plan    PT Assessment Patient needs continued PT services  PT Problem List Decreased strength;Decreased balance;Decreased activity tolerance;Decreased mobility       PT Treatment Interventions Gait training;DME instruction;Functional mobility training;Stair training;Therapeutic activities;Therapeutic exercise;Balance training;Patient/family education    PT Goals (Current goals can be found in the Care Plan  section)  Acute Rehab PT Goals Patient Stated Goal: improve endurance PT Goal Formulation: With patient Time For Goal Achievement: 01/08/20 Potential to Achieve Goals: Good    Frequency Min 3X/week   Barriers to discharge        Co-evaluation               AM-PAC PT "6 Clicks" Mobility  Outcome Measure Help needed turning from your back to your side while in a flat bed without using bedrails?: None Help needed moving from lying on your back to sitting on the side of a flat bed without using bedrails?: None Help needed moving to and from a bed to a chair (including a wheelchair)?: None Help needed standing up from a chair using your arms (e.g., wheelchair or bedside chair)?: None Help needed to walk in hospital room?: A Little Help needed climbing 3-5 steps with a railing? : A Little 6 Click Score: 22    End of Session Equipment Utilized During Treatment: Gait belt Activity Tolerance: Patient tolerated treatment well Patient left: in chair;with call bell/phone within reach Nurse Communication: Mobility status PT Visit Diagnosis: Unsteadiness on feet (R26.81);Muscle weakness (generalized) (M62.81);Difficulty in walking, not elsewhere classified (R26.2)    Time: [id number] PT Time Calculation (min) (ACUTE ONLY): 18 min   Charges:   PT Evaluation $PT Eval Moderate Complexity: 1 Mod            Wyona Almas, PT, DPT Acute Rehabilitation Services Pager [phone] Office [phone]   Deno Etienne 12/25/2019, 10:09 AM

## 2019-12-25 NOTE — Plan of Care (Signed)

## 2019-12-25 NOTE — Consult Note (Addendum)
NAME:  Travis Moyer, MRN:  [id number], DOB:  1931-08-23, LOS: 2 ADMISSION DATE:  12/23/2019, CONSULTATION DATE:  12/25/19 REFERRING MD:  Virgina Jock - cards, CHIEF COMPLAINT:  Dyspnea  Brief History   86yo male admitted with worsening shortness of breath and chest pain, workup on admission positive for dense LM and multivessel calcification and concern for unstable angina patient underwent RHC with successful PCI to prox-mid LAD.  History of present illness   Travis Moyer is a 86yo male with PMX significant for active prostate cancer, HTN, HLD, GERD, DDD, and arthritis who presented with complain of worsening SOB and chest pain. Patient reports he has been dealing with progressive shortness of breath for a couple years now. He states the shortness of breath started during activity but has no progressed to being present at rest. He has seen both primary cardiology and pulmonology for treatment of his subjective shortness of breath. Cardiology started diuretic therapy with recommended dietary restrictions.   He received a high resolution chest CT  08/20/2019 that was negative for any acute pulmonary abnormalities. On 09/24/2019 he saw Dr. Lenice Llamas for a outpatient pulmonary consult for the continued progressive dyspnea. At that time it was felt the dyspnea was multifactorial in the setting of glucocorticoid excess related edema, weight gain, and subsequent chest wall restriction.   Due to presence of LM and multivessel calcification with concern of unstable angina patient was admitted to cardiology service and underwent a RHC 5/26 and successful PCI to proxi-mid LAD. Despite successful PCI procedure patient continues to complain of subjective dyspnea therefore pulmonary consult was placed.  Past Medical History  Active prostate cancer on chronic steroid therapy  Neuropathy HTN HLD GERD DDD Dyspnea Arthritis   Significant Hospital Events   5/25 LHC with PCI of LAD 5/27 increased  SOB, PCCM consulted   Consults:  PCCM  Procedures:  5/25 LHC with PCI of LAD> LM 20% disease, LAD 80-90% severe calcific disease, diag1 with ostial 50% stenosis. Ramus: mild disease. LCx: no significant disease. RCA severe medial calcification with mild distal disease  Significant Diagnostic Tests:  5/26 ECHO> LVEF 75%, hyperdynamic function. No regional wall motion abnormalities. Grade I diastolic dysfunction. Normal RV systolic function. Mild aortic valve stenosis. Moderate to severe dilation of ascending aorta measuring 1mm  5/26 vas lower extremity> no evidence of pseudoaneurysm   Micro Data:  5/26 SARS Cov2> neg   Antimicrobials:    Interim history/subjective:  Off of supplemental O2 Speaking with family at bedside in NAD with no conversational dyspnea   Objective   Blood pressure 115/69, pulse 98, temperature 98.5 F (36.9 C), temperature source Oral, resp. rate 20, height 5\' 9"  (1.753 m), weight 113.6 kg, SpO2 94 %.        Intake/Output Summary (Last 24 hours) at 12/25/2019 1406 Last data filed at 12/25/2019 1121 Gross per 24 hour  Intake 240 ml  Output 1300 ml  Net -1060 ml   Filed Weights   12/23/19 1600 12/23/19 1845 12/23/19 2032  Weight: 116.6 kg 116.6 kg 113.6 kg    Examination: General: Chronically ill, obese, older adult M, reclined in bed NAD  HENT: NCAT pink mm trachea midline Lungs: Symmetrical chest expansion, No accessory muscle use on RA with even and unlabored respirations. Bilateral crackles.  Cardiovascular: RRR 1+ radial pulses. BLE edema Abdomen: Obese, soft round Extremities: Symmetrical bulk and tone no obvious joint deformity Neuro: AAOx3 following commands  GU: defer  Resolved Hospital Problem list  Assessment & Plan:   Shortness of breath, acute on chronic -hx SOB x 2 years. Pt endorses acute worsening overnight, however also states this is more improved today at time of consult.  -SpO2 95-100% on RA. Able to speak in complete  sentences, with some transient desat from 100 to 95 conversationally.  -has been seen by [facility] previously (Dr. Shearon Stalls in 2/21, previously has seen Dr. Luan Pulling), at this time SOB felt to be more related to chronic glucocorticoid-realted edema (Rx in setting of prostate cancer) and not intrinsic lung disease. Trelegy rx by Dr. Luan Pulling but not helpful, Dr. Shearon Stalls added trial of albuterol. Pt does not feel this is helpful. -physical exam with BLE edema, crackles-- suspect pulmonary edema in this case  P -CXR  -Continue diuresis, consider acute increase in dosing as renal function will allow  -IS, Flutter -Supplemental O2 not indicated with present SpO2, can add PRN for SpO2 goal > 92%  -Consider palliative management as adjunct for SOB-- would like to see CXR and optimize volume status as able, but this may be reasonable route to consider   CAD with unstable angina  S/p atherectomy and PCI to LAD -ASA, brilinta per cards  HTN HLD -norvasc, imdur, cozaar, toprol xl, lasix, crestor  Active prostate cancer -chronic glucocorticoids were prescribed in this setting.  -notably, onc has recommended transition to palliative care (note dated 11/25/19 at [facility], with recommendation dated 3/21) as pt prioritizes quality of life, has declined re-staging scans. Primary concern for patient has been SOB  Best practice:  Diet: heart healthy  Pain/Anxiety/Delirium protocol (if indicated): na VAP protocol (if indicated): na DVT prophylaxis: brilinta  GI prophylaxis: na Glucose control: monitor Mobility: PT  Code Status: Full  Family Communication: pt updated at bedside  Disposition: cardiac tele   Labs   CBC: Recent Labs  Lab 12/23/19 2102 12/24/19 0217 12/25/19 0210  WBC 11.2* 12.4* 10.5  NEUTROABS  --  7.3 6.3  HGB 13.3 12.1* 12.6*  HCT 40.6 36.3* 38.6*  MCV 94.9 94.5 95.5  PLT 208 196 [id number]    Basic Metabolic Panel: Recent Labs  Lab 12/23/19 2102 12/24/19 0217 12/25/19 0210  NA 140 140  141  K 3.5 3.1* 3.9  CL 102 103 110  CO2 25 25 22   GLUCOSE 148* 110* 137*  BUN 12 11 10   CREATININE 1.18 1.08 1.05  CALCIUM 8.8* 8.5* 8.5*   GFR: Estimated Creatinine Clearance: 63.9 mL/min (by C-G formula based on SCr of 1.05 mg/dL). Recent Labs  Lab 12/23/19 2102 12/24/19 0217 12/25/19 0210  WBC 11.2* 12.4* 10.5    Liver Function Tests: No results for input(s): AST, ALT, ALKPHOS, BILITOT, PROT, ALBUMIN in the last 168 hours. No results for input(s): LIPASE, AMYLASE in the last 168 hours. No results for input(s): AMMONIA in the last 168 hours.  ABG No results found for: PHART, PCO2ART, PO2ART, HCO3, TCO2, ACIDBASEDEF, O2SAT   Coagulation Profile: Recent Labs  Lab 12/24/19 0217  INR 1.1    Cardiac Enzymes: No results for input(s): CKTOTAL, CKMB, CKMBINDEX, TROPONINI in the last 168 hours.  HbA1C: Hgb A1c MFr Bld  Date/Time Value Ref Range Status  12/24/2019 02:17 AM 6.6 (H) 4.8 - 5.6 % Final    Comment:    (NOTE) Pre diabetes:          5.7%-6.4% Diabetes:              >6.4% Glycemic control for   <7.0% adults with diabetes  CBG: Recent Labs  Lab 12/24/19 0618 12/24/19 1147 12/24/19 1622 12/24/19 2116 12/25/19 0623  GLUCAP 124* 115* 148* 125* 130*    Review of Systems:   + SOB, productive cough, seasonal rhinitis  + orthopnea, DOE + intermittent chest pain, BLE edema + intentional weight loss   Past Medical History  He,  has a past medical history of Arthritis, Avulsion, skin (1980), Broken ribs, Chest pain, Contusion of liver, DDD (degenerative disc disease), lumbar, Dyspnea, GERD (gastroesophageal reflux disease), Hyperlipidemia, Hypertension, Neuropathy, and Prostate cancer ([place]) (06/1994).   Surgical History    Past Surgical History:  Procedure Laterality Date  . CARDIAC CATHETERIZATION  2002  . CATARACT EXTRACTION W/PHACO Left 09/01/2016   Procedure: CATARACT EXTRACTION PHACO AND INTRAOCULAR LENS PLACEMENT (IOC);  Surgeon: Baruch Goldmann, MD;  Location: [facility];  Service: Ophthalmology;  Laterality: Left;  CDE: 8.81  . CATARACT EXTRACTION W/PHACO Right 09/29/2016   Procedure: CATARACT EXTRACTION PHACO AND INTRAOCULAR LENS PLACEMENT (IOC);  Surgeon: Baruch Goldmann, MD;  Location: [facility];  Service: Ophthalmology;  Laterality: Right;  CDE: 25.87  . CORONARY ATHERECTOMY N/A 12/24/2019   Procedure: CORONARY ATHERECTOMY;  Surgeon: Nigel Mormon, MD;  Location: [place] [facility];  Service: Cardiovascular;  Laterality: N/A;  . ESOPHAGOGASTRODUODENOSCOPY (EGD) WITH ESOPHAGEAL DILATION N/A 07/22/2013   Procedure: ESOPHAGOGASTRODUODENOSCOPY (EGD) WITH ESOPHAGEAL DILATION;  Surgeon: Rogene Houston, MD;  Location: [facility];  Service: Endoscopy;  Laterality: N/A;  730  . INTRAVASCULAR ULTRASOUND/IVUS N/A 12/24/2019   Procedure: Intravascular Ultrasound/IVUS;  Surgeon: Nigel Mormon, MD;  Location: [place] [facility];  Service: Cardiovascular;  Laterality: N/A;  . LEFT HEART CATH AND CORONARY ANGIOGRAPHY N/A 12/23/2019   Procedure: LEFT HEART CATH AND CORONARY ANGIOGRAPHY;  Surgeon: Nigel Mormon, MD;  Location: [place] [facility];  Service: Cardiovascular;  Laterality: N/A;  . prostate cancer     x 20 yrs.   Marland Kitchen RADIOACTIVE SEED IMPLANT  06/1994  . TONSILLECTOMY    . TONSILLECTOMY AND ADENOIDECTOMY       Social History   reports that he quit smoking about 52 years ago. His smoking use included cigarettes. He has a 7.50 pack-year smoking history. He has never used smokeless tobacco. He reports current alcohol use. He reports that he does not use drugs.   Family History   His family history includes Coronary artery disease in his brother; Kidney cancer in his mother; Lung cancer in his father.   Allergies Allergies  Allergen Reactions  . Decongest-Aid [Pseudoephedrine] Other (See Comments)    unknown     Home Medications  Prior to Admission medications   Medication Sig Start Date End Date Taking?  Authorizing Provider  acetaminophen (TYLENOL) 325 MG tablet Take 650 mg by mouth every 6 (six) hours as needed for mild pain.    Yes [provider]  fexofenadine (ALLEGRA) 180 MG tablet Take 180 mg by mouth daily.   Yes [provider]  Fluticasone-Umeclidin-Vilant (TRELEGY ELLIPTA) 100-62.5-25 MCG/INH AEPB Inhale into the lungs.   Yes [provider]  gabapentin (NEURONTIN) 300 MG capsule Take 600 mg by mouth 2 (two) times daily. & 900 mg at bedtime   Yes [provider]  glipiZIDE (GLUCOTROL) 5 MG tablet Take 5 mg by mouth daily. 11/26/19  Yes [provider]  isosorbide mononitrate (IMDUR) 30 MG 24 hr tablet Take 30 mg by mouth daily. 12/09/19  Yes [provider]  leuprolide (LUPRON) 3.75 MG injection Inject 3.75 mg into  the muscle every 6 (six) months. Next is due 10-2016   Yes [provider]  metoprolol succinate (TOPROL-XL) 25 MG 24 hr tablet Take 25 mg by mouth daily. 12/09/19  Yes [provider]  Multiple Vitamins-Minerals (MULTIVITAMIN WITH MINERALS) tablet Take 1 tablet by mouth daily.   Yes [provider]  omeprazole (PRILOSEC) 20 MG capsule TAKE 1 CAPSULE BY MOUTH DAILY Patient taking differently: Take 20 mg by mouth daily.  01/03/18  Yes Rehman, Mechele Dawley, MD  potassium chloride (KLOR-CON) 10 MEQ tablet Take 20 mEq by mouth daily. 11/06/19  Yes [provider]  rosuvastatin (CRESTOR) 40 MG tablet Take 40 mg by mouth daily. 08/26/19  Yes [provider]  Tamsulosin HCl (FLOMAX) 0.4 MG CAPS Take 0.4 mg by mouth daily.   Yes [provider]  vitamin B-12 (CYANOCOBALAMIN) 1000 MCG tablet Take 1,500 mcg by mouth daily.    Yes [provider]  amLODipine (NORVASC) 5 MG tablet TAKE 1 TABLET BY MOUTH EVERY DAY Patient taking differently: Take 5 mg by mouth daily.  08/19/18   Herminio Commons, MD  aspirin EC 81 MG EC tablet Take 1 tablet (81 mg total) by mouth daily. 12/25/19    Patwardhan, Reynold Bowen, MD  furosemide (LASIX) 40 MG tablet Take 1 tablet (40 mg total) by mouth 2 (two) times daily. 12/25/19   Patwardhan, Reynold Bowen, MD  hydrochlorothiazide (HYDRODIURIL) 25 MG tablet Take 25 mg by mouth daily. 12/09/19   [provider]  losartan (COZAAR) 50 MG tablet Take 1 tablet (50 mg total) by mouth daily. 12/25/19   Patwardhan, Reynold Bowen, MD  nitroGLYCERIN (NITROSTAT) 0.4 MG SL tablet Place 1 tablet (0.4 mg total) under the tongue every 5 (five) minutes x 3 doses as needed for chest pain. 12/25/19   Patwardhan, Reynold Bowen, MD  ticagrelor (BRILINTA) 90 MG TABS tablet Take 1 tablet (90 mg total) by mouth 2 (two) times daily. 12/25/19   Patwardhan, Reynold Bowen, MD    Eliseo Gum MSN, AGACNP-BC [place] [id number] If no answer, [id number] 12/25/2019, 2:07 PM

## 2019-12-25 NOTE — Discharge Instructions (Signed)
Angiogram, Care After This sheet gives you information about how to care for yourself after your procedure. Your doctor may also give you more specific instructions. If you have problems or questions, contact your doctor. Follow these instructions at home: Insertion site care  Follow instructions from your doctor about how to take care of your long, thin tube (catheter) insertion area. Make sure you: ? Wash your hands with soap and water before you change your bandage (dressing). If you cannot use soap and water, use hand sanitizer. ? Change your bandage as told by your doctor. ? Leave stitches (sutures), skin glue, or skin tape (adhesive) strips in place. They may need to stay in place for 2 weeks or longer. If tape strips get loose and curl up, you may trim the loose edges. Do not remove tape strips completely unless your doctor says it is okay.  Do not take baths, swim, or use a hot tub until your doctor says it is okay.  You may shower 24-48 hours after the procedure or as told by your doctor. ? Gently wash the area with plain soap and water. ? Pat the area dry with a clean towel. ? Do not rub the area. This may cause bleeding.  Do not apply powder or lotion to the area. Keep the area clean and dry.  Check your insertion area every day for signs of infection. Check for: ? More redness, swelling, or pain. ? Fluid or blood. ? Warmth. ? Pus or a bad smell. Activity  Rest as told by your doctor, usually for 1-2 days.  Do not lift anything that is heavier than 10 lbs. (4.5 kg) or as told by your doctor.  Do not drive for 24 hours if you were given a medicine to help you relax (sedative).  Do not drive or use heavy machinery while taking prescription pain medicine. General instructions   Go back to your normal activities as told by your doctor, usually in about a week. Ask your doctor what activities are safe for you.  If the insertion area starts to bleed, lie flat and put  pressure on the area. If the bleeding does not stop, get help right away. This is an emergency.  Drink enough fluid to keep your pee (urine) clear or pale yellow.  Take over-the-counter and prescription medicines only as told by your doctor.  Keep all follow-up visits as told by your doctor. This is important. Contact a doctor if:  You have a fever.  You have chills.  You have more redness, swelling, or pain around your insertion area.  You have fluid or blood coming from your insertion area.  The insertion area feels warm to the touch.  You have pus or a bad smell coming from your insertion area.  You have more bruising around the insertion area.  Blood collects in the tissue around the insertion area (hematoma) that may be painful to the touch. Get help right away if:  You have a lot of pain in the insertion area.  The insertion area swells very fast.  The insertion area is bleeding, and the bleeding does not stop after holding steady pressure on the area.  The area near or just beyond the insertion area becomes pale, cool, tingly, or numb. These symptoms may be an emergency. Do not wait to see if the symptoms will go away. Get medical help right away. Call your local emergency services (911 in the [place] Do not drive yourself to the hospital.   Summary  After the procedure, it is common to have bruising and tenderness at the long, thin tube insertion area.  After the procedure, it is important to rest and drink plenty of fluids.  Do not take baths, swim, or use a hot tub until your doctor says it is okay to do so. You may shower 24-48 hours after the procedure or as told by your doctor.  If the insertion area starts to bleed, lie flat and put pressure on the area. If the bleeding does not stop, get help right away. This is an emergency. This information is not intended to replace advice given to you by your health care provider. Make sure you discuss any questions you have  with your health care provider. Document Revised: 06/29/2017 Document Reviewed: 07/11/2016 Elsevier Patient Education  [place].      Ticagrelor oral tablet What is this medicine? TICAGRELOR (TYE ka GREL or) helps to prevent blood clots. This medicine is used to prevent heart attack, stroke, or other vascular events in people who have had a recent heart attack or who have severe chest pain. It is also used to lower the chance of stroke or heart attack in people with a medical condition called coronary artery disease. This medicine may be used for other purposes; ask your health care provider or pharmacist if you have questions. COMMON BRAND NAME(S): BRILINTA What should I tell my health care provider before I take this medicine? They need to know if you have any of these conditions:  bleeding disorders  bleeding in the brain  having surgery  history of irregular heartbeat  history of stomach bleeding  liver disease  an unusual or allergic reaction to ticagrelor, other medicines, foods, dyes, or preservatives  pregnant or trying to get pregnant  breast-feeding How should I use this medicine? Take this medicine by mouth with a glass of water. Follow the directions on the prescription label. You can take it with or without food. If it upsets your stomach, take it with food. Take your medicine at regular intervals. Do not take it more often than directed. Do not stop taking except on your doctor's advice. A special MedGuide will be given to you by the pharmacist with each prescription and refill. Be sure to read this information carefully each time. Talk to your pediatrician regarding the use of this medicine in children. Special care may be needed. Overdosage: If you think you have taken too much of this medicine contact a poison control center or emergency room at once. NOTE: This medicine is only for you. Do not share this medicine with others. What if I miss a  dose? If you miss a dose, take it as soon as you can. If it is almost time for your next dose, take only that dose. Do not take double or extra doses. What may interact with this medicine? Do not take this medicine with any of the following medications:  defibrotide  itraconazole This medicine may also interact with the following medications:  aspirin  certain antibiotics like clarithromycin, telithromycin, and rifampin  certain antiviral medicines for HIV or AIDS like atazanavir, indinavir, nelfinavir, ritonavir, and saquinavir  certain medicines for cholesterol like lovastatin and simvastatin  certain medicines for fungal infections like ketoconazole and voriconazole  certain medicines for seizures like carbamazepine, phenobarbital, and phenytoin  digoxin  narcotic medicines for pain  nefazodone This list may not describe all possible interactions. Give your health care provider a list of all the  medicines, herbs, non-prescription drugs, or dietary supplements you use. Also tell them if you smoke, drink alcohol, or use illegal drugs. Some items may interact with your medicine. What should I watch for while using this medicine? Visit your healthcare professional for regular checks on your progress. Your condition will be monitored carefully while you are receiving this medicine. It is important not to miss any appointments. Notify your doctor or health care professional and seek emergency treatment if you develop breathing problems; changes in vision; chest pain; severe, sudden headache; pain, swelling, warmth in the leg; trouble speaking; sudden numbness or weakness of the face, arm, or leg. These can be signs that your condition has gotten worse. If you are going to have surgery or dental work, tell your doctor or health care professional that you are taking this medicine. You should take aspirin every day with this medicine. Do not take more than 100 mg each day. Talk to your  healthcare professional if you have questions. What side effects may I notice from receiving this medicine? Side effects that you should report to your doctor or health care professional as soon as possible:  allergic reactions like skin rash, itching or hives, swelling of the face, lips, or tongue  breathing problems  fast or irregular heartbeat  feeling faint or light-headed, falls  signs and symptoms of bleeding such as bloody or black, tarry stools; red or dark-brown urine; spitting up blood or brown material that looks like coffee grounds; red spots on the skin; unusual bruising or bleeding from the eye, gums, or nose Side effects that usually do not require medical attention (report to your doctor or health care professional if they continue or are bothersome):  breast enlargement in both males and females  diarrhea  dizziness  headache  tiredness  upset stomach This list may not describe all possible side effects. Call your doctor for medical advice about side effects. You may report side effects to FDA at 1-800-FDA-1088. Where should I keep my medicine? Keep out of the reach of children. Store at room temperature of 59 to 86 degrees F (15 to 30 degrees C). Throw away any unused medicine after the expiration date. NOTE: This sheet is a summary. It may not cover all possible information. If you have questions about this medicine, talk to your doctor, pharmacist, or health care provider.  2020 Elsevier/Gold Standard (2018-12-30 21:21:41)

## 2019-12-25 NOTE — TOC Progression Note (Signed)
Transition of Care [facility]) - Progression Note    Patient Details  Name: Travis Moyer MRN: [id number] Date of Birth: 1931/12/27  Transition of Care [facility]) CM/SW Contact  Zenon Mayo, RN Phone Number: 12/25/2019, 10:18 AM  Clinical Narrative:    NCM spoke with [name] in [place] he states the price came back as 234.00 , but they are filing the first 30 days free with coupon. NCM will let patient know, but also doing benefit check.        Expected Discharge Plan and Services           Expected Discharge Date: 12/25/19                                     Social Determinants of Health (SDOH) Interventions    Readmission Risk Interventions No flowsheet data found.

## 2019-12-25 NOTE — Progress Notes (Signed)
Subjective:  Still short of breath  Objective:  Vital Signs in the last 24 hours: Temp:  [97.6 F (36.4 C)-98.5 F (36.9 C)] 98.5 F (36.9 C) (05/27 1121) Pulse Rate:  [86-101] 98 (05/27 1121) Resp:  [15-23] 20 (05/27 1121) BP: (115-158)/(67-96) 115/69 (05/27 1121) SpO2:  [93 %-97 %] 94 % (05/27 1121)  Intake/Output from previous day: 05/26 0701 - 05/27 0700 In: 448.6 [P.O.:420; I.V.:28.6] Out: 800 [Urine:800]  Physical Exam  Constitutional: He is oriented to person, place, and time. He appears well-developed and well-nourished. No distress.  HENT:  Head: Normocephalic and atraumatic.  Eyes: Pupils are equal, round, and reactive to light. Conjunctivae are normal.  Neck: No JVD present.  Cardiovascular: Normal rate, regular rhythm and intact distal pulses.  Murmur heard.  Harsh midsystolic murmur is present with a grade of 2/6 at the upper right sternal border radiating to the neck. Pulmonary/Chest: Breath sounds normal. Tachypnea noted. He is in respiratory distress (Mild). He has no wheezes. He has no rales.  Abdominal: Soft. Bowel sounds are normal. There is no rebound.  Musculoskeletal:        General: No edema.  Lymphadenopathy:    He has no cervical adenopathy.  Neurological: He is alert and oriented to person, place, and time. No cranial nerve deficit.  Skin: Skin is warm and dry.  Psychiatric: He has a normal mood and affect.  Nursing note and vitals reviewed.    Lab Results: BMP Recent Labs    12/23/19 2102 12/24/19 0217 12/25/19 0210  NA 140 140 141  K 3.5 3.1* 3.9  CL 102 103 110  CO2 25 25 22   GLUCOSE 148* 110* 137*  BUN 12 11 10   CREATININE 1.18 1.08 1.05  CALCIUM 8.8* 8.5* 8.5*  GFRNONAA 56* >60 >60  GFRAA >60 >60 >60    CBC Recent Labs  Lab 12/25/19 0210  WBC 10.5  RBC 4.04*  HGB 12.6*  HCT 38.6*  PLT 201  MCV 95.5  MCH 31.2  MCHC 32.6  RDW 12.4  LYMPHSABS 3.1  MONOABS 0.9  EOSABS 0.1  BASOSABS 0.0    HEMOGLOBIN A1C Lab  Results  Component Value Date   HGBA1C 6.6 (H) 12/24/2019   MPG 142.72 12/24/2019    BNP (last 3 results) Recent Labs    12/24/19 0217  BNP 37.8   Lipid Panel     Component Value Date/Time   CHOL 77 12/24/2019 0217   TRIG 80 12/24/2019 0217   HDL 30 (L) 12/24/2019 0217   CHOLHDL 2.6 12/24/2019 0217   VLDL 16 12/24/2019 0217   LDLCALC 31 12/24/2019 0217     Cardiac Studies:  EKG 12/25/2019: Sinus tachycardia 103 bpm.  Right bundle branch block. Compared to previous EKG"s on 12/24/2019, rate is faster.  Echocardiogram 12/24/2019: 1. Left ventricular ejection fraction, by estimation, is >75%. The left  ventricle has hyperdynamic function. The left ventricle has no regional  wall motion abnormalities. There is mild asymmetric left ventricular  hypertrophy of the basal-septal segment.  Left ventricular diastolic parameters are consistent with Grade I  diastolic dysfunction (impaired relaxation).  2. Right ventricular systolic function is normal. The right ventricular  size is normal. Tricuspid regurgitation signal is inadequate for assessing  PA pressure.  3. The mitral valve is grossly normal. No evidence of mitral valve  regurgitation. No evidence of mitral stenosis.  4. The aortic valve is tricuspid. Aortic valve regurgitation is not  visualized. Mild aortic valve stenosis. Aortic valve area, by VTI  measures  1.95 cm. Aortic valve mean gradient measures 10.0 mmHg. Aortic valve Vmax  measures 2.17 m/s.  5. There is moderate to severe dilatation of the ascending aorta  measuring 44 mm.   Coronary intervention 12/24/2019: LM: 20% disease LAD: Prox-mid 80-90% severe calcific disease Diag1 with ostial 50% stenosis Ramus: Mild disease LCx: No significant disease RCA: Severe medial calcification with mild distal disease   Successful percutaneous coronary intervention prox-mid LAD        PTCA and stent placement 3.0 X 34 mm Onyx drug-eluting stent         PTCA and stent placement 3.5 X 22 mm Onyx drug-eluting stent        Post dilatation prox LAD stent using up to 4.0X8 [place] balloon    Assessment & Recommendations:  86 y.o. Caucasian male  with hypertension, hyperlipidemia, type 2 DM, moderate aortic stenosis, h/o prostate cancer, CAD  CAD: Atherectomy and PCI to LAD Aspirin and Brilinta till 11/2020 Continue metoprolol succinate, Imdur, statin Imdur may be weaned off outpatient  Hypertension: Controlled   Shortness of breath: Continues to have dyspnea and hypoxia at rest. Appears to be baseline for him for last several months. At the request of his family, I have sought pulmonary consult.  DM: SSI.  Nigel Mormon, M.D. [facility], [place] Pager: [phone] Office: [phone]

## 2019-12-26 LAB — CBC WITH DIFFERENTIAL/PLATELET
Abs Immature Granulocytes: 0.03 10*3/uL (ref 0.00–0.07)
Basophils Absolute: 0 10*3/uL (ref 0.0–0.1)
Basophils Relative: 0 %
Eosinophils Absolute: 0.2 10*3/uL (ref 0.0–0.5)
Eosinophils Relative: 1 %
HCT: 38.2 % — ABNORMAL LOW (ref 39.0–52.0)
Hemoglobin: 12.7 g/dL — ABNORMAL LOW (ref 13.0–17.0)
Immature Granulocytes: 0 %
Lymphocytes Relative: 29 %
Lymphs Abs: 3.4 10*3/uL (ref 0.7–4.0)
MCH: 31.1 pg (ref 26.0–34.0)
MCHC: 33.2 g/dL (ref 30.0–36.0)
MCV: 93.6 fL (ref 80.0–100.0)
Monocytes Absolute: 1 10*3/uL (ref 0.1–1.0)
Monocytes Relative: 9 %
Neutro Abs: 7 10*3/uL (ref 1.7–7.7)
Neutrophils Relative %: 61 %
Platelets: 192 10*3/uL (ref 150–400)
RBC: 4.08 MIL/uL — ABNORMAL LOW (ref 4.22–5.81)
RDW: 12.4 % (ref 11.5–15.5)
WBC: 11.7 10*3/uL — ABNORMAL HIGH (ref 4.0–10.5)
nRBC: 0 % (ref 0.0–0.2)

## 2019-12-26 LAB — BASIC METABOLIC PANEL
Anion gap: 10 (ref 5–15)
BUN: 10 mg/dL (ref 8–23)
CO2: 25 mmol/L (ref 22–32)
Calcium: 8.7 mg/dL — ABNORMAL LOW (ref 8.9–10.3)
Chloride: 104 mmol/L (ref 98–111)
Creatinine, Ser: 1.09 mg/dL (ref 0.61–1.24)
GFR calc Af Amer: >60 mL/min (ref >60)
GFR calc non Af Amer: >60 mL/min (ref >60)
Glucose, Bld: 162 mg/dL — ABNORMAL HIGH (ref 70–99)
Potassium: 3.4 mmol/L — ABNORMAL LOW (ref 3.5–5.1)
Sodium: 139 mmol/L (ref 135–145)

## 2019-12-26 MED ORDER — POTASSIUM CHLORIDE CRYS ER 20 MEQ PO TBCR
40.0000 meq | EXTENDED_RELEASE_TABLET | Freq: Once | ORAL | Status: AC
  Administered 2019-12-26: 40 meq via ORAL
  Filled 2019-12-26: qty 2

## 2019-12-26 MED ORDER — POTASSIUM CHLORIDE ER 20 MEQ PO TBCR: 40.0000 meq | EXTENDED_RELEASE_TABLET | Freq: Every day | ORAL | 1 refills | Status: AC

## 2019-12-26 MED FILL — POTASSIUM CHLORIDE 20meqER: 20 | 30 days supply | Qty: 60 | Fill #0

## 2019-12-26 NOTE — Evaluation (Signed)
Occupational Therapy Evaluation and Discharge Patient Details Name: Travis Moyer MRN: [id number] DOB: 07-06-1932 Today's Date: 12/26/2019    History of Present Illness Pt is a 86 y.o. M with significant PMH of HTN, type 2 DM, moderate aortic stenosis, h/o prostate CA, who presents with dyspnea with minimal activity and retrosternal chest pain. Now s/p left coronary atherectomy 12/24/2019.    Clinical Impression   Pt walked with a cane and was assisted for LB dressing PTA. He presents with impaired balance requiring supervision to min guard assist for mobility with RW and decreased endurance. Pt educated in energy conservation strategies which he verbalized understanding. No further OT needs.    Follow Up Recommendations  No OT follow up    Equipment Recommendations  None recommended by OT    Recommendations for Other Services       Precautions / Restrictions Precautions Precautions: Fall Restrictions Weight Bearing Restrictions: No      Mobility Bed Mobility                  Transfers Overall transfer level: Needs assistance Equipment used: Rolling walker (2 wheeled) Transfers: Sit to/from Stand Sit to Stand: Supervision              Balance Overall balance assessment: Mild deficits observed, not formally tested                                         ADL either performed or assessed with clinical judgement   ADL Overall ADL's : Needs assistance/impaired Eating/Feeding: Independent   Grooming: Min guard;Standing   Upper Body Bathing: Set up;Cueing for UE precautions   Lower Body Bathing: Minimal assistance;Sit to/from stand   Upper Body Dressing : Set up;Sitting   Lower Body Dressing: Sit to/from stand;Moderate assistance Lower Body Dressing Details (indicate cue type and reason): pt is not interested in AE for LB dressing, prefers to continue relying on his wife Toilet Transfer: Min guard;Ambulation;RW            Functional mobility during ADLs: Min guard;Rolling walker       Vision Baseline Vision/History: Wears glasses Wears Glasses: At all times Patient Visual Report: No change from baseline       Perception     Praxis      Pertinent Vitals/Pain Pain Assessment: No/denies pain     Hand Dominance Right   Extremity/Trunk Assessment Upper Extremity Assessment Upper Extremity Assessment: Overall WFL for tasks assessed   Lower Extremity Assessment Lower Extremity Assessment: Defer to PT evaluation       Communication Communication Communication: No difficulties   Cognition Arousal/Alertness: Awake/alert Behavior During Therapy: WFL for tasks assessed/performed Overall Cognitive Status: Within Functional Limits for tasks assessed                                     General Comments       Exercises     Shoulder Instructions      Home Living Family/patient expects to be discharged to:: Private residence Living Arrangements: Spouse/significant other Available Help at Discharge: Family;Available 24 hours/day Type of Home: House Home Access: Stairs to enter [employer] of Steps: 6 Entrance Stairs-Rails: Right;Left Home Layout: One level     Bathroom Shower/Tub: Occupational psychologist: Handicapped height  Home Equipment: Clinical cytogeneticist - 4 wheels;Cane - single point;Hand held shower head;Grab bars - tub/shower          Prior Functioning/Environment Level of Independence: Independent with assistive device(s);Needs assistance    ADL's / Homemaking Assistance Needed: wife helps put on compression hose and socks   Comments: uses cane, household ambulator, assists wife with IADL        OT Problem List: Impaired balance (sitting and/or standing);Decreased activity tolerance      OT Treatment/Interventions:      OT Goals(Current goals can be found in the care plan section) Acute Rehab OT Goals Patient Stated Goal:  improve endurance OT Goal Formulation: With patient  OT Frequency:     Barriers to D/C:            Co-evaluation              AM-PAC OT "6 Clicks" Daily Activity     Outcome Measure Help from another person eating meals?: None Help from another person taking care of personal grooming?: A Little Help from another person toileting, which includes using toliet, bedpan, or urinal?: A Little Help from another person bathing (including washing, rinsing, drying)?: A Little Help from another person to put on and taking off regular upper body clothing?: None Help from another person to put on and taking off regular lower body clothing?: A Little 6 Click Score: 20   End of Session Equipment Utilized During Treatment: Rolling walker;Gait belt  Activity Tolerance: Patient tolerated treatment well Patient left: in chair;with call bell/phone within reach  OT Visit Diagnosis: Other abnormalities of gait and mobility (R26.89)                Time: [id number] OT Time Calculation (min): 17 min Charges:  OT General Charges $OT Visit: 1 Visit OT Evaluation $OT Eval Moderate Complexity: 1 Mod  Travis Moyer 12/26/2019, 10:57 AM  Nestor Lewandowsky, OTR/L Acute Rehabilitation Services Pager: [phone] Office: [phone]

## 2019-12-26 NOTE — Progress Notes (Signed)
10:32 - 10:49  Dicussed ambulating patient with primary RN. She states she walked him this morning and his HR was elevated after to 120. PT did well. RN is currently trying to allow pt's HR to drop below 100. Ambulation held. Questioned patient regarding his C/R education from yesterday. He voices he has no questions. Planning for discharge home today.   Lesly Rubenstein MS, ACSM-EP-C, CCRP

## 2019-12-26 NOTE — TOC Progression Note (Signed)
Transition of Care [facility]) - Progression Note    Patient Details  Name: Travis Moyer MRN: [id number] Date of Birth: 29-May-1932  [facility]) CM/SW Contact  Zenon Mayo, RN Phone Number: 12/26/2019, 10:16 AM  Clinical Narrative:    The brilinta cost each month will be 99.77 for 30 day supply per Dora from the benefit check.        Expected Discharge Plan and Services           Expected Discharge Date: 12/26/19                                     Social Determinants of Health (SDOH) Interventions    Readmission Risk Interventions No flowsheet data found.

## 2019-12-26 NOTE — Discharge Summary (Signed)
Physician Discharge Summary  Patient ID: LODEN ALBER MRN: [id number] DOB/AGE: 84-Mar-1933 84 y.o.  Admit date: 12/23/2019 Discharge date: 12/26/2019  Primary Discharge Diagnosis: Unstable angina Exertional dyspnea Coronary artery disease  Secondary Discharge Diagnosis: Mild aortic stenosis Hypertension Type 2 diabetes melitus Prostate cancer  Hospital Course:   86 y.o. Caucasian male  with hypertension, hyperlipidemia, type 2 DM, mild aortic stenosis, h/o prostate cancer, now with dyspnea with minimal activity, retrosternal chest pain, severe LAD disease.  Patient underwent successful complex atherectomy and PCI to prox-mid LAD in a staged manner. Details below. Patient did not have any recurrence of chest pain. However, patient and family were very concerned about his chronic shortness of breath. Patient did not have any hypoxia with ambulation. Patient was also evaluated by pulmonology at patient's wife's request. While he had atelectasis on chest Xray, there was no evidence of active pneumonia. His shortness of breath is multifactorial-including deconditioning, obesity. This did improve after the PCI. He has sinus tachycardia with minimal ambulation, which is also likely due to deconditioning. He has inferior T wave inversions with sinus tachycardia, which improves at lower rate. These rate related changes without symptoms of chest pain are likely non-consequential.   After long discussion with patient and his wife, they both understood that some of patient;s symptoms are chronic and do not warrant continued inpatient medical admission. In fact, he is at higher risk of hospital acquired infections and other complications with prolonged hospital stay. He will continue close follow up with his PCP, as well as primary cardiologist Dr. Candis Musa (Appt scheduled on June 8). He will also follow up with cardiac rehab in [place], [place].  Patient was discharged on 12/26/19.  Discharge  Exam: Blood pressure (!) 96/58, pulse 89, temperature 97.6 F (36.4 C), temperature source Oral, resp. rate (!) 24, height 5\' 9"  (1.753 m), weight 113.6 kg, SpO2 94 %.   Repeat vitals personally obtained by me: 12/26/2019 3:00 PM BP 123/84 mmHg. HR 89 bpm. O2 sat 98% Prior low blood pressure numbers are outliers   Physicla Exam: Constitutional: He is oriented to person, place, and time. He appears well-developed and well-nourished. No distress.  HENT:  Head: Normocephalic and atraumatic.  Eyes: Pupils are equal, round, and reactive to light. Conjunctivae are normal.  Neck: No JVD present.  Cardiovascular: Normal rate, regular rhythm and intact distal pulses.  Murmur heard.  Harsh midsystolic murmur is present with a grade of 2/6 at the upper right sternal border radiating to the neck. Pulmonary/Chest: Breath sounds normal. No tahypnea/respiratory distress/wheezes/rales. Abdominal: Soft. Bowel sounds are normal. There is no rebound.  Musculoskeletal:        General: No edema.  Lymphadenopathy:    He has no cervical adenopathy.  Neurological: He is alert and oriented to person, place, and time. No cranial nerve deficit.  Skin: Skin is warm and dry.  Psychiatric: He has a normal mood and affect.  Nursing note and vitals reviewed.   Recommendations on discharge:  Deep breathing exercises at home Follow up with PCP and Dr. Candis Musa.    Significant Diagnostic Studies:  EKG 12/27/2019: Sinus rhythm 93 bpm. RBBB. Left posterior fascicular block Nonspecific ST-T changes  Echocardiogram 12/24/2019: 1. Left ventricular ejection fraction, by estimation, is >75%. The left  ventricle has hyperdynamic function. The left ventricle has no regional  wall motion abnormalities. There is mild asymmetric left ventricular  hypertrophy of the basal-septal segment.  Left ventricular diastolic parameters are consistent with Grade I  diastolic dysfunction (impaired  relaxation).  2. Right  ventricular systolic function is normal. The right ventricular  size is normal. Tricuspid regurgitation signal is inadequate for assessing  PA pressure.  3. The mitral valve is grossly normal. No evidence of mitral valve  regurgitation. No evidence of mitral stenosis.  4. The aortic valve is tricuspid. Aortic valve regurgitation is not  visualized. Mild aortic valve stenosis. Aortic valve area, by VTI measures  1.95 cm. Aortic valve mean gradient measures 10.0 mmHg. Aortic valve Vmax  measures 2.17 m/s.  5. There is moderate to severe dilatation of the ascending aorta  measuring 44 mm.   Coronary intervention 12/24/2019: LM: 20% disease LAD: Prox-mid 80-90% severe calcific disease Diag1 with ostial 50% stenosis Ramus: Mild disease LCx: No significant disease RCA: Severe medial calcification with mild distal disease   Successful percutaneous coronary intervention prox-mid LAD PTCA and stent placement 3.0 X 34 mm Onyx drug-eluting stent PTCA and stent placement 3.5 X 22 mm Onyx drug-eluting stent Post dilatation prox LAD stent using up to 4.0X8 [place] balloon     Labs:   Lab Results  Component Value Date   WBC 11.7 (H) 12/26/2019   HGB 12.7 (L) 12/26/2019   HCT 38.2 (L) 12/26/2019   MCV 93.6 12/26/2019   PLT 192 12/26/2019    Recent Labs  Lab 12/26/19 0147  NA 139  K 3.4*  CL 104  CO2 25  BUN 10  CREATININE 1.09  CALCIUM 8.7*  GLUCOSE 162*    Lipid Panel     Component Value Date/Time   CHOL 77 12/24/2019 0217   TRIG 80 12/24/2019 0217   HDL 30 (L) 12/24/2019 0217   CHOLHDL 2.6 12/24/2019 0217   VLDL 16 12/24/2019 0217   LDLCALC 31 12/24/2019 0217    BNP (last 3 results) Recent Labs    12/24/19 0217  BNP 37.8    HEMOGLOBIN A1C Lab Results  Component Value Date   HGBA1C 6.6 (H) 12/24/2019   MPG 142.72 12/24/2019    Cardiac Panel (last 3 results) No results for input(s): CKTOTAL, CKMB, TROPONINI, RELINDX in the last  8760 hours.  Lab Results  Component Value Date   TROPONINI <0.03 03/01/2018     TSH No results for input(s): TSH in the last 8760 hours.  Radiology: CARDIAC CATHETERIZATION  Result Date: 12/24/2019 LM: 20% disease LAD: Prox-mid 80-90% severe calcific disease        Diag1 with ostial 50% stenosis Ramus: Mild disease LCx: No significant disease RCA: Severe medial calcification with mild distal disease  Successful percutaneous coronary intervention prox-mid LAD        PTCA and stent placement 3.0 X 34 mm Onyx drug-eluting stent        PTCA and stent placement 3.5 X 22 mm Onyx drug-eluting stent        Post dilatation prox LAD stent using up to 4.0X8 [place] balloon   CARDIAC CATHETERIZATION  Result Date: 12/24/2019 LM: 20% disease LAD: Prox-mid 80-90% severe calcific disease        Diag1 with ostial 50% stenosis Ramus: Mild disease LCx: No significant disease RCA: Sever medial calcification with mild distal disease LVEDP normal  DG CHEST PORT 1 VIEW  Result Date: 12/25/2019 CLINICAL DATA:  Shortness of breath. EXAM: PORTABLE CHEST 1 VIEW COMPARISON:  03/01/2018 FINDINGS: 1330 hours. Low volume film. Left base atelectasis or infiltrate. No substantial pleural effusion. No pulmonary edema. Heart size within normal limits. Visualized bony anatomy intact. Telemetry leads overlie the chest. IMPRESSION: Low volume  film with left base atelectasis or infiltrate. Electronically Signed   By: Misty Stanley M.D.   On: 12/25/2019 14:56   VAS [place] GROIN PSEUDOANEURYSM  Result Date: 12/24/2019  ARTERIAL PSEUDOANEURYSM  Exam: Right groin Indications: Patient complains of groin pain and bruising. History: S/p catheterization. Performing Technologist: June Leap RDMS, RVT  Examination Guidelines: A complete evaluation includes B-mode imaging, spectral Doppler, color Doppler, and power Doppler as needed of all accessible portions of each vessel. Bilateral testing is considered an integral part of a complete  examination. Limited examinations for reoccurring indications may be performed as noted. +------------+----------+---------+------+----------+ Right DuplexPSV (cm/s)Waveform PlaqueComment(s) +------------+----------+---------+------+----------+ CFA             94    triphasic                 +------------+----------+---------+------+----------+ Right Vein comments:patent CFV  Summary: No evidence of pseudoaneurysm, AVF or DVT  Diagnosing physician: Curt Jews MD Electronically signed by Curt Jews MD on 12/24/2019 at 7:13:05 PM.   --------------------------------------------------------------------------------    Final    ECHOCARDIOGRAM COMPLETE  Result Date: 12/24/2019    ECHOCARDIOGRAM REPORT   Patient Name:   HARVEL EISENMAN Date of Exam: 12/24/2019 Medical Rec #:  [id number]          Height:       69.0 in Accession #:    [id number]         Weight:       250.4 lb Date of Birth:  May 27, 1932          BSA:          2.273 m Patient Age:    30 years           BP:           150/83 mmHg Patient Gender: M                  HR:           91 bpm. Exam Location:  Inpatient Procedure: 2D Echo, Cardiac Doppler and Color Doppler Indications:    R07.9* Chest pain, unspecified  History:        Patient has prior history of Echocardiogram examinations, most                 recent 01/12/2017. Signs/Symptoms:Dyspnea; Risk                 Factors:Hypertension, Dyslipidemia and GERD.  Sonographer:    Jonelle Sidle Dance Referring Phys: [id number] [place]  Sonographer Comments: No subcostal window. IMPRESSIONS  1. Left ventricular ejection fraction, by estimation, is >75%. The left ventricle has hyperdynamic function. The left ventricle has no regional wall motion abnormalities. There is mild asymmetric left ventricular hypertrophy of the basal-septal segment.  Left ventricular diastolic parameters are consistent with Grade I diastolic dysfunction (impaired relaxation).  2. Right ventricular systolic function is  normal. The right ventricular size is normal. Tricuspid regurgitation signal is inadequate for assessing PA pressure.  3. The mitral valve is grossly normal. No evidence of mitral valve regurgitation. No evidence of mitral stenosis.  4. The aortic valve is tricuspid. Aortic valve regurgitation is not visualized. Mild aortic valve stenosis. Aortic valve area, by VTI measures 1.95 cm. Aortic valve mean gradient measures 10.0 mmHg. Aortic valve Vmax measures 2.17 m/s.  5. There is moderate to severe dilatation of the ascending aorta measuring 44 mm. FINDINGS  Left Ventricle: Left ventricular ejection fraction, by estimation, is >75%. The left ventricle  has hyperdynamic function. The left ventricle has no regional wall motion abnormalities. The left ventricular internal cavity size was normal in size. There is mild asymmetric left ventricular hypertrophy of the basal-septal segment. Left ventricular diastolic parameters are consistent with Grade I diastolic dysfunction (impaired relaxation). Right Ventricle: The right ventricular size is normal. No increase in right ventricular wall thickness. Right ventricular systolic function is normal. Tricuspid regurgitation signal is inadequate for assessing PA pressure. Left Atrium: Left atrial size was normal in size. Right Atrium: Right atrial size was normal in size. Pericardium: Trivial pericardial effusion is present. Presence of pericardial fat pad. Mitral Valve: The mitral valve is grossly normal. Mild mitral annular calcification. No evidence of mitral valve regurgitation. No evidence of mitral valve stenosis. Tricuspid Valve: The tricuspid valve is grossly normal. Tricuspid valve regurgitation is not demonstrated. No evidence of tricuspid stenosis. Aortic Valve: The aortic valve is tricuspid. . There is moderate thickening and moderate calcification of the aortic valve. Aortic valve regurgitation is not visualized. Mild aortic stenosis is present. There is moderate  thickening of the aortic valve. There is moderate calcification of the aortic valve. Aortic valve mean gradient measures 10.0 mmHg. Aortic valve peak gradient measures 18.8 mmHg. Aortic valve area, by VTI measures 1.95 cm. Pulmonic Valve: The pulmonic valve was grossly normal. Pulmonic valve regurgitation is not visualized. No evidence of pulmonic stenosis. Aorta: The aortic root is normal in size and structure. There is moderate to severe dilatation of the ascending aorta measuring 44 mm. Venous: The inferior vena cava was not well visualized. IAS/Shunts: The atrial septum is grossly normal.  LEFT VENTRICLE PLAX 2D LVIDd:         4.14 cm  Diastology LVIDs:         3.24 cm  LV e' lateral:   8.05 cm/s LV PW:         0.98 cm  LV E/e' lateral: 12.2 LV IVS:        1.16 cm  LV e' medial:    7.07 cm/s LVOT diam:     2.10 cm  LV E/e' medial:  13.8 LV SV:         83 LV SV Index:   36 LVOT Area:     3.46 cm  RIGHT VENTRICLE RV Basal diam:  2.76 cm RV S prime:     9.36 cm/s TAPSE (M-mode): 2.3 cm LEFT ATRIUM             Index       RIGHT ATRIUM           Index LA diam:        3.80 cm 1.67 cm/m  RA Area:     16.30 cm LA Vol (A2C):   39.6 ml 17.42 ml/m RA Volume:   40.40 ml  17.77 ml/m LA Vol (A4C):   29.4 ml 12.93 ml/m LA Biplane Vol: 34.0 ml 14.96 ml/m  AORTIC VALVE AV Area (Vmax):    1.92 cm AV Area (Vmean):   1.99 cm AV Area (VTI):     1.95 cm AV Vmax:           217.00 cm/s AV Vmean:          142.500 cm/s AV VTI:            0.425 m AV Peak Grad:      18.8 mmHg AV Mean Grad:      10.0 mmHg LVOT Vmax:         120.50  cm/s LVOT Vmean:        81.750 cm/s LVOT VTI:          0.239 m LVOT/AV VTI ratio: 0.56  AORTA Ao Root diam: 3.90 cm Ao Asc diam:  4.40 cm MITRAL VALVE MV Area (PHT): 2.87 cm     SHUNTS MV Decel Time: 264 msec     Systemic VTI:  0.24 m MV E velocity: 97.90 cm/s   Systemic Diam: 2.10 cm MV A velocity: 122.00 cm/s MV E/A ratio:  0.80 Eleonore Chiquito MD Electronically signed by Eleonore Chiquito MD Signature  Date/Time: 12/24/2019/3:34:15 PM    Final       FOLLOW UP PLANS AND APPOINTMENTS Discharge Instructions    Amb Referral to Cardiac Rehabilitation   Complete by: As directed    Referral to [facility], [place], [place], [address] [phone]   Diagnosis:  Coronary Stents PTCA Stable Angina     After initial evaluation and assessments completed: Virtual Based Care may be provided alone or in conjunction with Phase 2 Cardiac Rehab based on patient barriers.: Yes   Diet - low sodium heart healthy   Complete by: As directed    Diet - low sodium heart healthy   Complete by: As directed    Increase activity slowly   Complete by: As directed    Increase activity slowly   Complete by: As directed      Allergies as of 12/26/2019      Reactions   Aspirin Other (See Comments)   Upset stomach   Decongest-aid [pseudoephedrine] Other (See Comments)   unknown      Medication List    STOP taking these medications   amLODipine 5 MG tablet Commonly known as: NORVASC   hydrochlorothiazide 25 MG tablet Commonly known as: HYDRODIURIL     TAKE these medications   acetaminophen 325 MG tablet Commonly known as: TYLENOL Take 650 mg by mouth every 6 (six) hours as needed for mild pain.   aspirin 81 MG EC tablet Take 1 tablet (81 mg total) by mouth daily.   fexofenadine 180 MG tablet Commonly known as: ALLEGRA Take 180 mg by mouth daily.   furosemide 40 MG tablet Commonly known as: LASIX Take 1 tablet (40 mg total) by mouth 2 (two) times daily. What changed: when to take this   gabapentin 300 MG capsule Commonly known as: NEURONTIN Take 600 mg by mouth 2 (two) times daily. & 900 mg at bedtime   glipiZIDE 5 MG tablet Commonly known as: GLUCOTROL Take 5 mg by mouth daily.   isosorbide mononitrate 30 MG 24 hr tablet Commonly known as: IMDUR Take 30 mg by mouth daily.   leuprolide 3.75 MG injection Commonly known as: LUPRON Inject 3.75 mg into the muscle  every 6 (six) months. Next is due 10-2016   losartan 50 MG tablet Commonly known as: COZAAR Take 1 tablet (50 mg total) by mouth daily. What changed:   medication strength  how much to take   metoprolol succinate 25 MG 24 hr tablet Commonly known as: TOPROL-XL Take 25 mg by mouth daily.   multivitamin with minerals tablet Take 1 tablet by mouth daily.   nitroGLYCERIN 0.4 MG SL tablet Commonly known as: NITROSTAT Place 1 tablet (0.4 mg total) under the tongue every 5 (five) minutes x 3 doses as needed for chest pain.   omeprazole 20 MG capsule Commonly known as: PRILOSEC TAKE 1 CAPSULE BY MOUTH DAILY   Potassium Chloride ER 20  MEQ Tbcr Take 40 mEq by mouth daily. What changed:   medication strength  how much to take   rosuvastatin 40 MG tablet Commonly known as: CRESTOR Take 40 mg by mouth daily.   tamsulosin 0.4 MG Caps capsule Commonly known as: FLOMAX Take 0.4 mg by mouth daily.   ticagrelor 90 MG Tabs tablet Commonly known as: BRILINTA Take 1 tablet (90 mg total) by mouth 2 (two) times daily.   Trelegy Ellipta 100-62.5-25 MCG/INH Aepb Generic drug: Fluticasone-Umeclidin-Vilant Inhale into the lungs.   vitamin B-12 1000 MCG tablet Commonly known as: CYANOCOBALAMIN Take 1,500 mcg by mouth daily.       Tme spent 90 min  Vernell Leep MD, [facility] Pager: [phone] Office: [phone] If no answer: [phone]

## 2019-12-26 NOTE — Progress Notes (Signed)
Dr Virgina Jock into patient room to discuss with family the POC.  Patient to go home, safest way in his manner  To rehabilitate and rebuild strength.  Discharge instructions reviewed with patient and spouse.  Both   verbalize understanding of instructions.  Written copy for their records given.  Patient via wheelchair to families' waiting  Car in stable condition

## 2019-12-26 NOTE — Progress Notes (Signed)
Physical Therapy Treatment Patient Details Name: Travis Moyer MRN: [id number] DOB: Jan 19, 1932 Today's Date: 12/26/2019    History of Present Illness Pt is a 86 y.o. M with significant PMH of HTN, type 2 DM, moderate aortic stenosis, h/o prostate CA, who presents with dyspnea with minimal activity and retrosternal chest pain. Now s/p left coronary atherectomy 12/24/2019.     PT Comments    Pt very pleasant sitting in chair on arrival reporting he has been walking with nursing staff. Pt with increased gait tolerance and able to perform stairs this session. Pt with no pain and HR 113-124.     Follow Up Recommendations  No PT follow up     Equipment Recommendations  None recommended by PT    Recommendations for Other Services       Precautions / Restrictions Precautions Precautions: Fall Restrictions Weight Bearing Restrictions: No    Mobility  Bed Mobility               General bed mobility comments: in chair on arrival and end of session  Transfers Overall transfer level: Needs assistance Equipment used: Rolling walker (2 wheeled) Transfers: Sit to/from Stand Sit to Stand: Supervision         General transfer comment: supervision for safety  Ambulation/Gait Ambulation/Gait assistance: Min guard Gait Distance (Feet): 200 Feet Assistive device: Rolling walker (2 wheeled) Gait Pattern/deviations: Step-through pattern;Decreased stride length;Trunk flexed   Gait velocity interpretation: >2.62 ft/sec, indicative of community ambulatory General Gait Details: cues for posture, steady gait with reliance on RW for long hall ambulation   Stairs Stairs: Yes Stairs assistance: Supervision Stair Management: Alternating pattern;Forwards;Two rails Number of Stairs: 10 General stair comments: pt with reliance on rail with pt continuing up full flight despite cues for only 4 stairs to simulate home   Wheelchair Mobility    Modified Rankin (Stroke Patients  Only)       Balance Overall balance assessment: Mild deficits observed, not formally tested                                          Cognition Arousal/Alertness: Awake/alert Behavior During Therapy: WFL for tasks assessed/performed Overall Cognitive Status: Within Functional Limits for tasks assessed                                        Exercises General Exercises - Lower Extremity Long Arc Quad: AROM;Both;Seated;10 reps Hip Flexion/Marching: AROM;Both;10 reps;Seated    General Comments        Pertinent Vitals/Pain Pain Assessment: No/denies pain    Home Living Family/patient expects to be discharged to:: Private residence Living Arrangements: Spouse/significant other Available Help at Discharge: Family;Available 24 hours/day Type of Home: House Home Access: Stairs to enter Entrance Stairs-Rails: Right;Left Home Layout: One level Home Equipment: Clinical cytogeneticist - 4 wheels;Cane - single point;Hand held shower head;Grab bars - tub/shower      Prior Function Level of Independence: Independent with assistive device(s);Needs assistance    ADL's / Homemaking Assistance Needed: wife helps put on compression hose and socks Comments: uses cane, household ambulator, assists wife with IADL   PT Goals (current goals can now be found in the care plan section) Acute Rehab PT Goals Patient Stated Goal: improve endurance Progress towards PT goals: Progressing toward goals  Frequency    Min 3X/week      PT Plan Current plan remains appropriate    Co-evaluation              AM-PAC PT "6 Clicks" Mobility   Outcome Measure  Help needed turning from your back to your side while in a flat bed without using bedrails?: None Help needed moving from lying on your back to sitting on the side of a flat bed without using bedrails?: None Help needed moving to and from a bed to a chair (including a wheelchair)?: None Help needed  standing up from a chair using your arms (e.g., wheelchair or bedside chair)?: None Help needed to walk in hospital room?: A Little Help needed climbing 3-5 steps with a railing? : A Little 6 Click Score: 22    End of Session Equipment Utilized During Treatment: Gait belt Activity Tolerance: Patient tolerated treatment well Patient left: in chair;with call bell/phone within reach Nurse Communication: Mobility status PT Visit Diagnosis: Unsteadiness on feet (R26.81);Muscle weakness (generalized) (M62.81);Difficulty in walking, not elsewhere classified (R26.2)     Time: [id number] PT Time Calculation (min) (ACUTE ONLY): 23 min  Charges:  $Gait Training: 8-22 mins $Therapeutic Exercise: 8-22 mins                     Loanne Emery P, PT Acute Rehabilitation Services Pager: [phone] Office: [place] 12/26/2019, 1:16 PM

## 2019-12-30 ENCOUNTER — Telehealth (HOSPITAL_COMMUNITY): Payer: Self-pay

## 2019-12-30 NOTE — Telephone Encounter (Signed)
Faxed referral to [facility] for Cardiac Rehab.

## 2020-07-01 IMAGING — CT CT ABD-PELV W/ CM
2 of 5 series · 16 of 46 positions shown, 18 images · IV contrast (Isovue)
Comparison: Abdominal ultrasound 04/04/2005

CLINICAL DATA: Chest and epigastric pain with vomiting x2 hours.

EXAM:
CT ABDOMEN AND PELVIS WITH CONTRAST
TECHNIQUE: Multidetector CT imaging of the abdomen and pelvis was performed
using the standard protocol following bolus administration of
intravenous contrast.
CONTRAST:  80mL [id number] IOPAMIDOL ([id number]-KYY) INJECTION 61%

[Series 2: axial st · axial · 0.86mm/px · z∈[+871,+1356]mm · 13 of 109 slices shown, 15 images]
[im 6/109  soft-tissue]
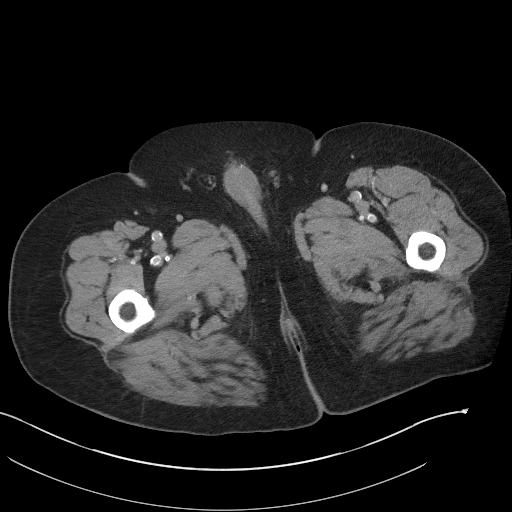
[im 6/109  bone]
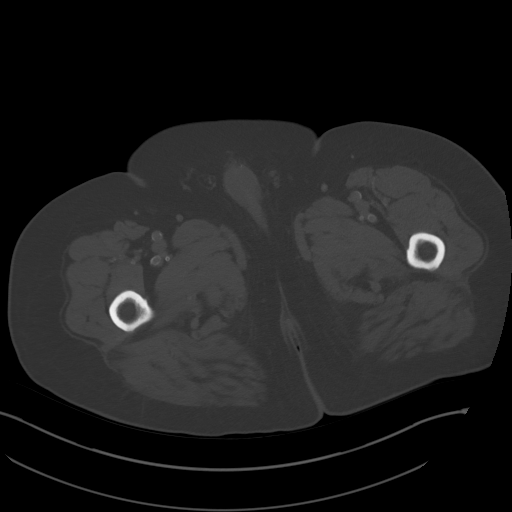
[im 18/109  soft-tissue]
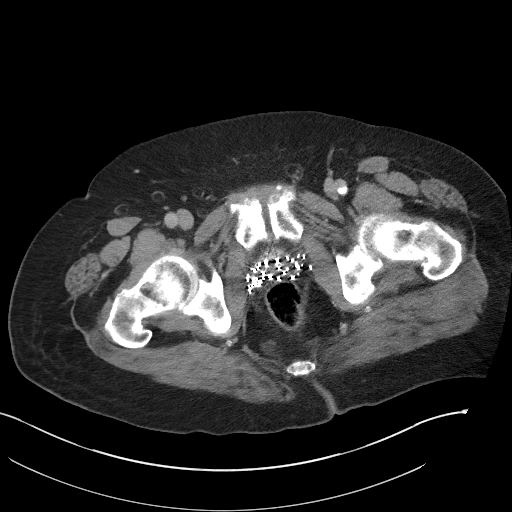
[im 23/109  soft-tissue]
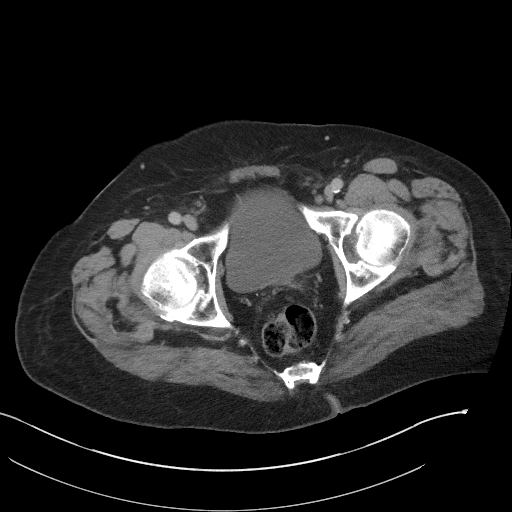
[im 29/109  soft-tissue]
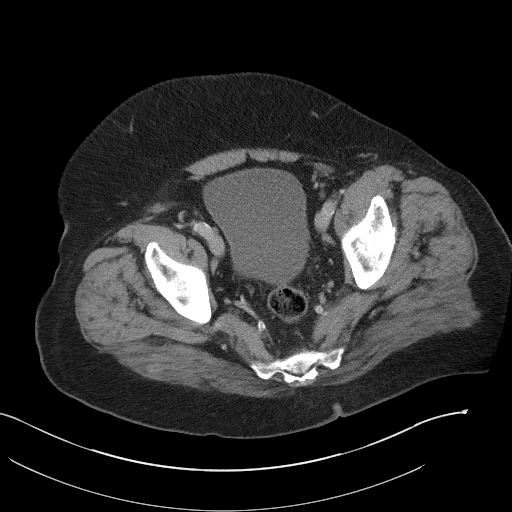
[im 40/109  soft-tissue]
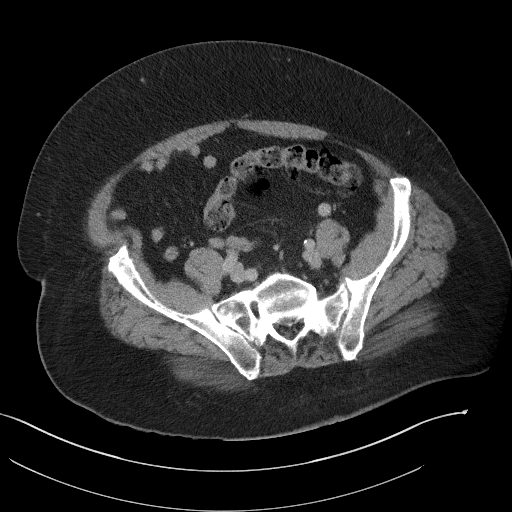
[im 46/109  soft-tissue]
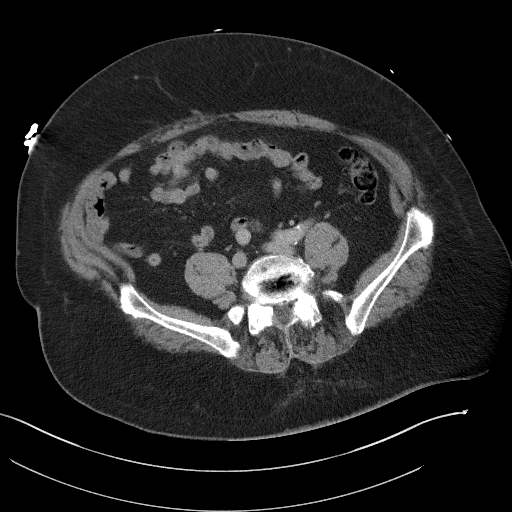
[im 57/109  soft-tissue]
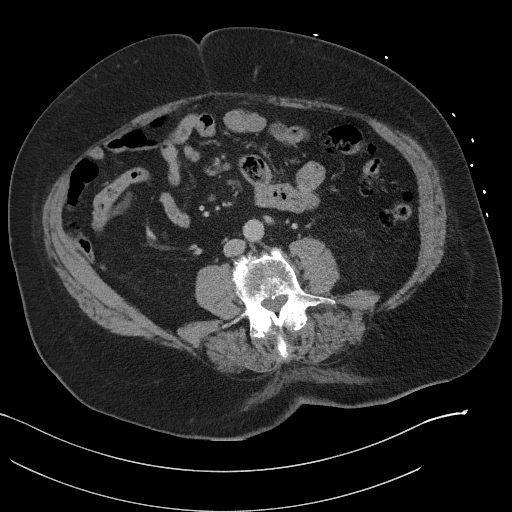
[im 63/109  soft-tissue]
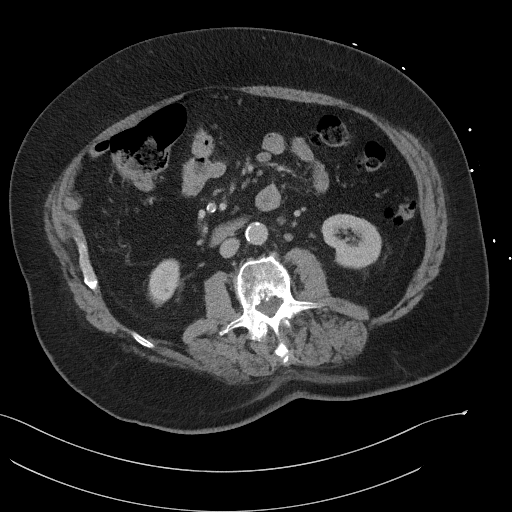
[im 69/109  soft-tissue]
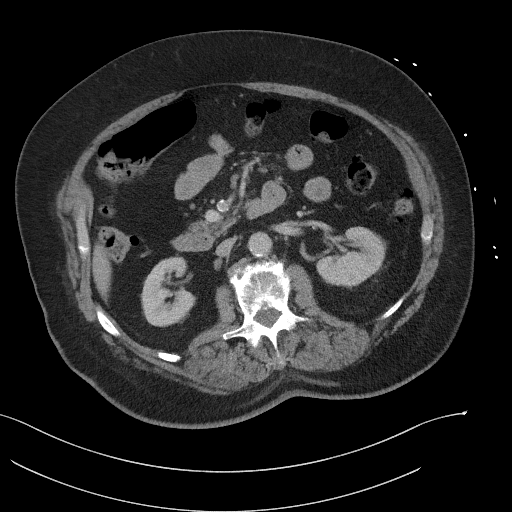
[im 69/109  bone]
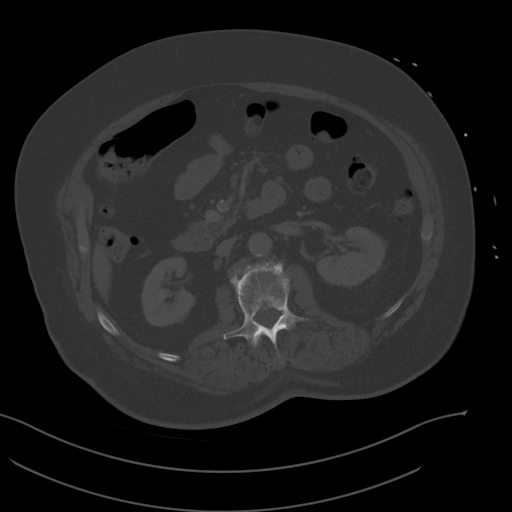
[im 80/109  soft-tissue]
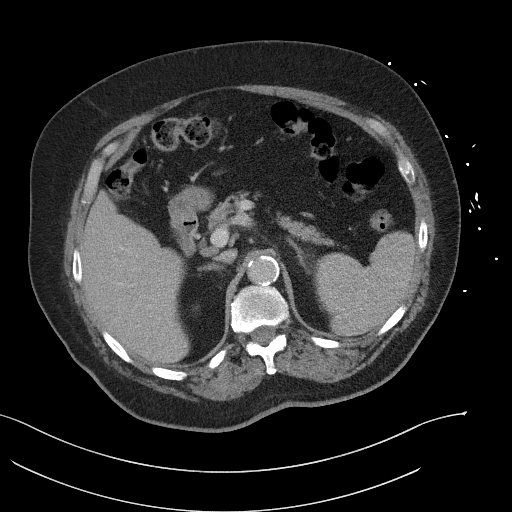
[im 86/109  soft-tissue]
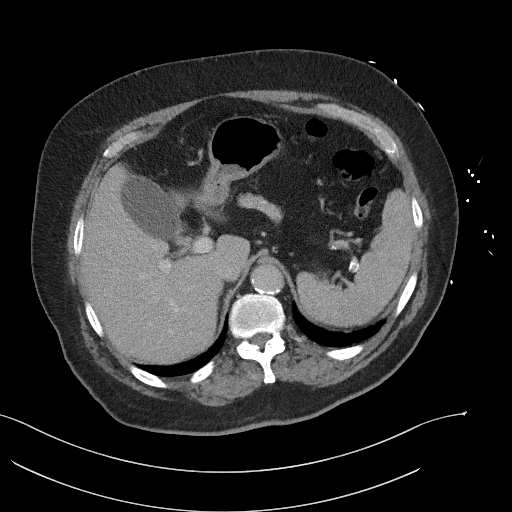
[im 91/109  soft-tissue]
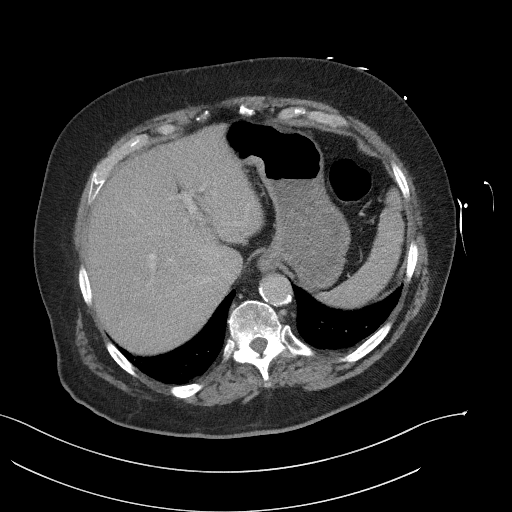
[im 103/109  soft-tissue]
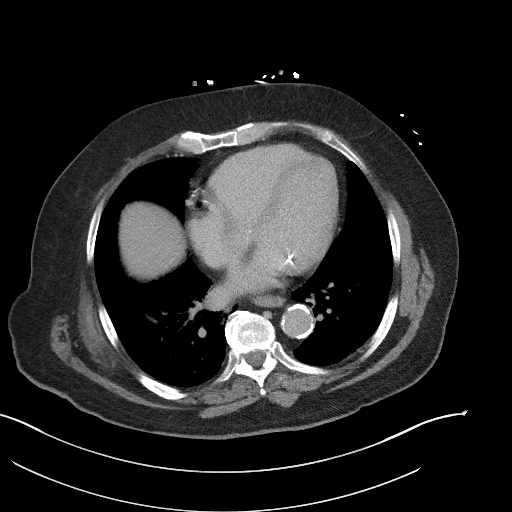

[Series 5: coronal st · coronal · 0.95mm/px · 3 of 110 slices shown]
[im 37/110  soft-tissue]
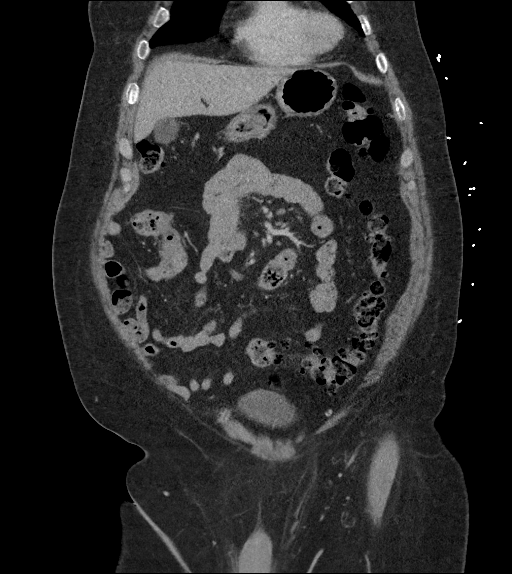
[im 49/110  soft-tissue]
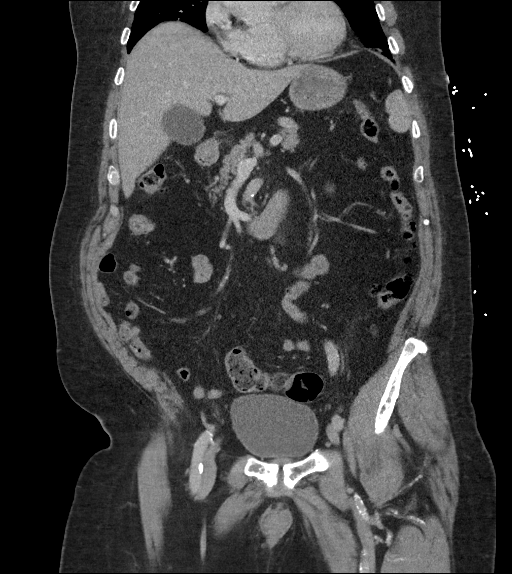
[im 61/110  soft-tissue]
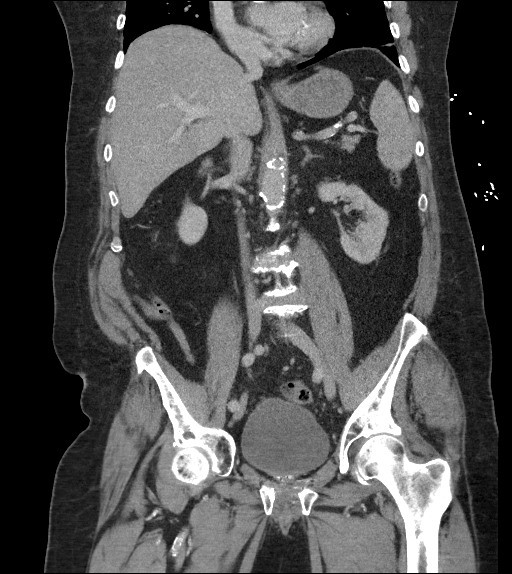

[16 of 46 positions shown; findings below may reference images not displayed]

FINDINGS: Lower chest: Cardiomegaly is noted with aortic atherosclerosis and
coronary arteriosclerosis. No pericardial effusion. Small hiatal
hernia is present. Dependent bibasilar atelectasis is noted with
subsegmental scarring in the left lower lobe.

Hepatobiliary: No space-occupying mass of the liver. The gallbladder
is free of stones. There is no biliary dilatation.

Pancreas: Normal

Spleen: Normal

Adrenals/Urinary Tract: Normal bilateral adrenal glands. Tiny too
small to characterize right lower pole renal hypodensity is likely
to reflect a tiny cyst. No nephrolithiasis nor
hydroureteronephrosis. The urinary bladder is physiologically
distended without focal mural thickening or calculi.

Stomach/Bowel: Physiologic distention of the stomach with normal
small bowel rotation. No bowel obstruction or inflammation. Distal
and terminal ileum are unremarkable. Appendix is unremarkable.
Moderate stool burden is noted within the colon without inflammation
or obstruction.

Vascular/Lymphatic: Moderate aortoiliac and branch vessel
atherosclerosis without aneurysm. No lymphadenopathy.

Reproductive: Brachytherapy seeds noted of the prostate. No
localized mass or invasion.

Other: No free air nor free fluid.

Musculoskeletal: No aggressive osseous lesions. Multilevel
degenerative disc disease of the lumbar spine consistent with lumbar
spondylosis.
IMPRESSION: 1. Small hiatal hernia.  No acute bowel obstruction or inflammation.
2. Moderate aortoiliac and branch vessel atherosclerosis.
3. Brachytherapy seeds noted in the prostate. No evidence of
osteoblastic metastatic disease.

## 2020-08-23 IMAGING — NM NM HEPATO W/GB/PHARM/[PERSON_NAME]
2 series · 12 of 12 positions shown · non-contrast
Comparison: None.

CLINICAL DATA: Abdominal pain and vomiting

EXAM:
NUCLEAR MEDICINE HEPATOBILIARY IMAGING WITH GALLBLADDER EF
VIEWS:
Anterior right upper quadrant
RADIOPHARMACEUTICALS:  5.5 mCi [id number]  Choletec IV

[Series 1: biliary · 3.25mm/px · 6 of 60 frames shown]
[frame 6/60]
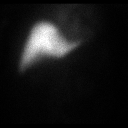
[frame 16/60]
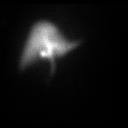
[frame 26/60]
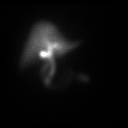
[frame 36/60]
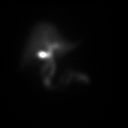
[frame 46/60]
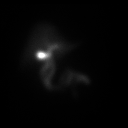
[frame 56/60]
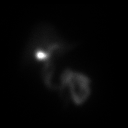

[Series 2: gbef · 3.25mm/px · 6 of 60 frames shown]
[frame 6/60]
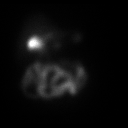
[frame 16/60]
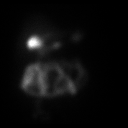
[frame 26/60]
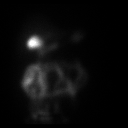
[frame 36/60]
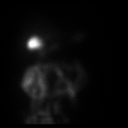
[frame 46/60]
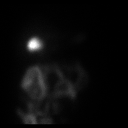
[frame 56/60]
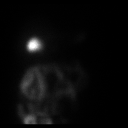

[12 of 12 positions shown; findings below may reference images not displayed]

FINDINGS: Liver uptake of radiotracer is unremarkable. There is visualization
of gallbladder and small bowel, indicating patency of the cystic and
common bile ducts. The patient consumed 8 ounces of Ensure orally
with calculation of the computer generated ejection fraction of
radiotracer from the gallbladder. The patient did experience nausea
with the oral Ensure consumption. The computer generated ejection
fraction of radiotracer from the gallbladder is diminished at 11%,
normal greater than 33% using the oral agent.
IMPRESSION: Abnormally low ejection fraction of radiotracer from the
gallbladder, a finding felt to be indicative of a degree of biliary
dyskinesia. Patient experience nausea with the oral Ensure
consumption. Cystic and common bile ducts are patent as is evidenced
by visualization of gallbladder and small bowel.
